# Patient Record
Sex: Male | Born: 1970 | Race: Black or African American | Hispanic: No | Marital: Single | Smoking: Former smoker
Health system: Southern US, Community
[De-identification: ages and names within clinical notes are randomized; demographics above are authoritative.]

## PROBLEM LIST (undated history)

## (undated) DIAGNOSIS — I10 Essential (primary) hypertension: Secondary | ICD-10-CM

---

## 2001-04-21 ENCOUNTER — Ambulatory Visit (HOSPITAL_COMMUNITY): Admission: RE | Admit: 2001-04-21 | Discharge: 2001-04-21 | Payer: Self-pay | Admitting: Pediatrics

## 2001-11-25 ENCOUNTER — Emergency Department (HOSPITAL_COMMUNITY): Admission: EM | Admit: 2001-11-25 | Discharge: 2001-11-25 | Payer: Self-pay | Admitting: *Deleted

## 2001-11-25 ENCOUNTER — Encounter: Payer: Self-pay | Admitting: *Deleted

## 2002-02-06 ENCOUNTER — Emergency Department (HOSPITAL_COMMUNITY): Admission: EM | Admit: 2002-02-06 | Discharge: 2002-02-06 | Payer: Self-pay | Admitting: Emergency Medicine

## 2002-05-22 ENCOUNTER — Emergency Department (HOSPITAL_COMMUNITY): Admission: EM | Admit: 2002-05-22 | Discharge: 2002-05-22 | Payer: Self-pay | Admitting: Emergency Medicine

## 2002-10-30 ENCOUNTER — Emergency Department (HOSPITAL_COMMUNITY): Admission: EM | Admit: 2002-10-30 | Discharge: 2002-10-30 | Payer: Self-pay | Admitting: Emergency Medicine

## 2009-12-07 ENCOUNTER — Emergency Department (HOSPITAL_COMMUNITY): Admission: EM | Admit: 2009-12-07 | Discharge: 2009-12-07 | Payer: Self-pay | Admitting: Emergency Medicine

## 2010-08-01 LAB — BASIC METABOLIC PANEL
BUN: 9 mg/dL (ref 6–23)
CO2: 25 mEq/L (ref 19–32)
Calcium: 9.1 mg/dL (ref 8.4–10.5)
Chloride: 103 mEq/L (ref 96–112)
Creatinine, Ser: 0.86 mg/dL (ref 0.4–1.5)
GFR calc Af Amer: >60 mL/min (ref >60)
GFR calc non Af Amer: >60 mL/min (ref >60)
Glucose, Bld: 67 mg/dL — ABNORMAL LOW (ref 70–99)
Potassium: 3.3 mEq/L — ABNORMAL LOW (ref 3.5–5.1)
Sodium: 136 mEq/L (ref 135–145)

## 2010-08-01 LAB — DIFFERENTIAL
Basophils Absolute: 0 10*3/uL (ref 0.0–0.1)
Basophils Relative: 1 % (ref 0–1)
Eosinophils Absolute: 0.1 10*3/uL (ref 0.0–0.7)
Eosinophils Relative: 3 % (ref 0–5)
Lymphocytes Relative: 46 % (ref 12–46)
Lymphs Abs: 2 10*3/uL (ref 0.7–4.0)
Monocytes Absolute: 0.4 10*3/uL (ref 0.1–1.0)
Monocytes Relative: 8 % (ref 3–12)
Neutro Abs: 1.8 10*3/uL (ref 1.7–7.7)
Neutrophils Relative %: 42 % — ABNORMAL LOW (ref 43–77)

## 2010-08-01 LAB — CBC
HCT: 46.3 % (ref 39.0–52.0)
Hemoglobin: 15.6 g/dL (ref 13.0–17.0)
MCH: 32.3 pg (ref 26.0–34.0)
MCHC: 33.8 g/dL (ref 30.0–36.0)
MCV: 95.6 fL (ref 78.0–100.0)
Platelets: 171 10*3/uL (ref 150–400)
RBC: 4.84 MIL/uL (ref 4.22–5.81)
RDW: 12.9 % (ref 11.5–15.5)
WBC: 4.3 10*3/uL (ref 4.0–10.5)

## 2010-08-01 LAB — TROPONIN I: Troponin I: 0.02 ng/mL (ref 0.00–0.06)

## 2010-08-01 LAB — CK TOTAL AND CKMB (NOT AT ARMC)
CK, MB: 1.8 ng/mL (ref 0.3–4.0)
Relative Index: 1 (ref 0.0–2.5)
Total CK: 182 U/L (ref 7–232)

## 2010-10-02 NOTE — Op Note (Signed)
[facility]  Patient:    Chad Robinson, Chad Robinson Visit Number: [id number] MRN: [id number]          Service Type: DSU Location: DAY Attending Physician:  Barbaraann Barthel Dictated by:   Barbaraann Barthel, M.D. Proc. Date: 04/21/01 Admit Date:  04/21/2001 Discharge Date: 04/21/2001   CC:         Avon Gully, M.D.   Operative Report  PREOPERATIVE DIAGNOSIS:  Phimosis.  POSTOPERATIVE DIAGNOSIS:  Phimosis.  PROCEDURE:  Circumcision.  SURGEON:  Barbaraann Barthel, M.D.  ANESTHESIA:  General anesthesia.  NOTE:  This is a 40 year old black male who had recurrent episodes of difficulty with phimosis and cleansing his penis and problems with chronic infections in this area because of his phimosis.  He was referred by Dr. Avon Gully for elective circumcision.  GROSS OPERATIVE FINDINGS:  Those consistent with a phimotic foreskin, otherwise, no other lesions noted.  TECHNIQUE:  The patient was placed in a supine position and after the adequate administration of general anesthesia via endotracheal intubation, his entire abdomen was prepped with Betadine solution and draped in the usual manner.  A dorsal and ventral incision was carried out down beyond the base of the glans penis and these incisions were then carried circumferentially around the penis and we then sutured the skin to the mucosal area with interrupted 4-0 chromic sutures.  There was a prominent dorsal vessel which was ligated with 4-0 Dexon.  Afterwards, the penis was dressed with a Xeroform gauze and a small stockinette dressing.  Prior to closure, all sponge, needle and instrument counts were found to be correct.  Estimated blood loss was minimal. There were no complications.  Please note, we had discussed preoperatively this procedure in detail with the patient as well as his sister who helps take care of him.  Informed consent was obtained. Dictated by:   Barbaraann Barthel,  M.D. Attending Physician:  Barbaraann Barthel DD:  04/21/01 TD:  04/21/01 Job: [id number]

## 2012-08-12 ENCOUNTER — Encounter (HOSPITAL_COMMUNITY): Payer: Self-pay | Admitting: *Deleted

## 2012-08-12 ENCOUNTER — Emergency Department (HOSPITAL_COMMUNITY)
Admission: EM | Admit: 2012-08-12 | Discharge: 2012-08-12 | Disposition: A | Discharge status: Home / Self Care | Payer: Medicaid Other | Attending: Emergency Medicine | Admitting: Emergency Medicine

## 2012-08-12 DIAGNOSIS — M25569 Pain in unspecified knee: Secondary | ICD-10-CM | POA: Insufficient documentation

## 2012-08-12 DIAGNOSIS — I10 Essential (primary) hypertension: Secondary | ICD-10-CM | POA: Insufficient documentation

## 2012-08-12 DIAGNOSIS — M25562 Pain in left knee: Secondary | ICD-10-CM

## 2012-08-12 DIAGNOSIS — M25469 Effusion, unspecified knee: Secondary | ICD-10-CM | POA: Insufficient documentation

## 2012-08-12 HISTORY — DX: Essential (primary) hypertension: I10

## 2012-08-12 MED ORDER — IBUPROFEN 800 MG PO TABS: 800.0000 mg | ORAL_TABLET | Freq: Three times a day (TID) | ORAL | Status: DC

## 2012-08-12 MED ORDER — IBUPROFEN 800 MG PO TABS
800.0000 mg | ORAL_TABLET | Freq: Once | ORAL | Status: AC
  Administered 2012-08-12: 800 mg via ORAL
  Filled 2012-08-12: qty 1

## 2012-08-12 NOTE — ED Provider Notes (Signed)
History     CSN: [id number]  Arrival date & time 08/12/12  0544   First MD Initiated Contact with Patient 08/12/12 [phone]      Chief Complaint  Patient presents with  . Knee Pain    left knee pain with not known injury    (Consider location/radiation/quality/duration/timing/severity/associated sxs/prior treatment) HPI Chad Robinson is a 42 y.o. male who presents to the Emergency Department complaining of left knee pain that began last night. He is unable to bend his knee all the way. 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 He has taken no medicines.  PCP Dr. Felecia Shelling  Past Medical History  Diagnosis Date  . Hypertension     History reviewed. No pertinent past surgical history.  No family history on file.  History  Substance Use Topics  . Smoking status: Never Smoker   . Smokeless tobacco: Never Used  . Alcohol Use: 2.4 oz/week    4 Cans of beer per week      Review of Systems  Constitutional: Negative for fever.       10 Systems reviewed and are negative for acute change except as noted in the HPI.  HENT: Negative for congestion.   Eyes: Negative for discharge and redness.  Respiratory: Negative for cough and shortness of breath.   Cardiovascular: Negative for chest pain.  Gastrointestinal: Negative for vomiting and abdominal pain.  Musculoskeletal: Negative for back pain.       Left knee pain and swelling  Skin: Negative for rash.  Neurological: Negative for syncope, numbness and headaches.  Psychiatric/Behavioral:       No behavior change.    Allergies  Review of patient's allergies indicates no known allergies.  Home Medications  No current outpatient prescriptions on file.  BP 129/94  Temp(Src) 99.5 F (37.5 C) (Oral)  Resp 20  Ht 5\' 11"  (1.803 m)  Wt 210 lb (95.255 kg)  BMI 29.3 kg/m2  SpO2 98%  Physical Exam  Nursing note and vitals reviewed. Constitutional: He appears well-developed and well-nourished.   Awake, alert, nontoxic appearance.  HENT:  Head: Normocephalic and atraumatic.  Eyes: EOM are normal. Pupils are equal, round, and reactive to light. Right eye exhibits no discharge. Left eye exhibits no discharge.  Neck: Neck supple.  Cardiovascular: Normal rate and intact distal pulses.   Pulmonary/Chest: Effort normal and breath sounds normal. He exhibits no tenderness.  Abdominal: Soft. Bowel sounds are normal. There is no tenderness. There is no rebound.  Musculoskeletal: He exhibits no tenderness.  Baseline ROM, no obvious new focal weakness. Left knee with mild effusion and tenderness to joint line on medial side of knee. Pulses 2+.  Neurological:  Mental status and motor strength appears baseline for patient and situation.  Skin: No rash noted.  Psychiatric: He has a normal mood and affect.    ED Course  Procedures (including critical care time)     MDM  Patient with left knee pain and mild swelling. Given ibuprofen. Pt stable in ED with no significant deterioration in condition.The patient appears reasonably screened and/or stabilized for discharge and I doubt any other medical condition or other [facility] requiring further screening, evaluation, or treatment in the ED at this time prior to discharge.  MDM Reviewed: nursing note and vitals           Nicoletta Dress. Colon Branch, MD 08/12/12 [phone]

## 2012-08-14 ENCOUNTER — Encounter (HOSPITAL_COMMUNITY): Payer: Self-pay | Admitting: *Deleted

## 2012-08-14 ENCOUNTER — Emergency Department (HOSPITAL_COMMUNITY)
Admission: EM | Admit: 2012-08-14 | Discharge: 2012-08-15 | Disposition: A | Discharge status: Home / Self Care | Payer: Medicaid Other | Attending: Emergency Medicine | Admitting: Emergency Medicine

## 2012-08-14 DIAGNOSIS — R110 Nausea: Secondary | ICD-10-CM | POA: Insufficient documentation

## 2012-08-14 DIAGNOSIS — R197 Diarrhea, unspecified: Secondary | ICD-10-CM

## 2012-08-14 DIAGNOSIS — N400 Enlarged prostate without lower urinary tract symptoms: Secondary | ICD-10-CM | POA: Insufficient documentation

## 2012-08-14 DIAGNOSIS — I10 Essential (primary) hypertension: Secondary | ICD-10-CM | POA: Insufficient documentation

## 2012-08-14 DIAGNOSIS — R109 Unspecified abdominal pain: Secondary | ICD-10-CM

## 2012-08-14 DIAGNOSIS — Z79899 Other long term (current) drug therapy: Secondary | ICD-10-CM | POA: Insufficient documentation

## 2012-08-14 DIAGNOSIS — D1809 Hemangioma of other sites: Secondary | ICD-10-CM | POA: Insufficient documentation

## 2012-08-14 DIAGNOSIS — R1084 Generalized abdominal pain: Secondary | ICD-10-CM | POA: Insufficient documentation

## 2012-08-14 DIAGNOSIS — D1803 Hemangioma of intra-abdominal structures: Secondary | ICD-10-CM

## 2012-08-14 LAB — CBC WITH DIFFERENTIAL/PLATELET
Basophils Relative: 0 % (ref 0–1)
Eosinophils Absolute: 0 10*3/uL (ref 0.0–0.7)
HCT: 39.2 % (ref 39.0–52.0)
Hemoglobin: 13.6 g/dL (ref 13.0–17.0)
Lymphs Abs: 1.5 10*3/uL (ref 0.7–4.0)
MCH: 31.4 pg (ref 26.0–34.0)
MCHC: 34.7 g/dL (ref 30.0–36.0)
Monocytes Absolute: 0.8 10*3/uL (ref 0.1–1.0)
Monocytes Relative: 9 % (ref 3–12)
Neutrophils Relative %: 75 % (ref 43–77)
RBC: 4.33 MIL/uL (ref 4.22–5.81)

## 2012-08-14 LAB — BASIC METABOLIC PANEL
BUN: 10 mg/dL (ref 6–23)
Chloride: 93 mEq/L — ABNORMAL LOW (ref 96–112)
Creatinine, Ser: 0.87 mg/dL (ref 0.50–1.35)
GFR calc Af Amer: >90 mL/min (ref >90)
GFR calc non Af Amer: >90 mL/min (ref >90)
Glucose, Bld: 125 mg/dL — ABNORMAL HIGH (ref 70–99)
Potassium: 3.5 mEq/L (ref 3.5–5.1)

## 2012-08-14 MED ORDER — ONDANSETRON HCL 4 MG/2ML IJ SOLN: 4.0000 mg | Freq: Once | INTRAMUSCULAR | Status: DC

## 2012-08-14 MED ORDER — MORPHINE SULFATE 4 MG/ML IJ SOLN
4.0000 mg | Freq: Once | INTRAMUSCULAR | Status: AC
  Administered 2012-08-15: 4 mg via INTRAVENOUS
  Filled 2012-08-14: qty 1

## 2012-08-14 MED ORDER — SODIUM CHLORIDE 0.9 % IV SOLN
Freq: Once | INTRAVENOUS | Status: AC
  Administered 2012-08-15: via INTRAVENOUS

## 2012-08-14 MED ORDER — ONDANSETRON HCL 4 MG/2ML IJ SOLN
4.0000 mg | Freq: Once | INTRAMUSCULAR | Status: AC
  Administered 2012-08-14: 4 mg via INTRAVENOUS
  Filled 2012-08-14: qty 2

## 2012-08-14 NOTE — ED Notes (Signed)
abd pain, onset this pm.   Nausea, no vomiting , Has diarrhea.  Dizzy.  Seen here on 3/29 for knee pain

## 2012-08-14 NOTE — ED Notes (Signed)
Dr Bebe Shaggy in room assessing pt at this time

## 2012-08-14 NOTE — ED Notes (Signed)
Pt states abdominal pain since yesterday, notes some diarrhea and nausea, denies vomiting. Abd appears distended. Notes taking ibuprofin 800mg  for a couple days and thinks that might be part of it.

## 2012-08-15 ENCOUNTER — Emergency Department (HOSPITAL_COMMUNITY): Payer: Medicaid Other

## 2012-08-15 ENCOUNTER — Encounter (HOSPITAL_COMMUNITY): Payer: Self-pay | Admitting: Radiology

## 2012-08-15 ENCOUNTER — Other Ambulatory Visit: Payer: Self-pay

## 2012-08-15 LAB — URINALYSIS, ROUTINE W REFLEX MICROSCOPIC
Bilirubin Urine: NEGATIVE
Glucose, UA: NEGATIVE mg/dL
Ketones, ur: NEGATIVE mg/dL
Protein, ur: 100 mg/dL — AB
pH: 6 (ref 5.0–8.0)

## 2012-08-15 LAB — URINE MICROSCOPIC-ADD ON

## 2012-08-15 LAB — HEPATIC FUNCTION PANEL
AST: 18 U/L (ref 0–37)
Albumin: 3.7 g/dL (ref 3.5–5.2)
Alkaline Phosphatase: 66 U/L (ref 39–117)
Bilirubin, Direct: 0.2 mg/dL (ref 0.0–0.3)
Total Bilirubin: 0.6 mg/dL (ref 0.3–1.2)

## 2012-08-15 MED ORDER — OXYCODONE-ACETAMINOPHEN 5-325 MG PO TABS: 1.0000 | ORAL_TABLET | ORAL | Status: DC | PRN

## 2012-08-15 MED ORDER — HYDROMORPHONE HCL PF 1 MG/ML IJ SOLN
1.0000 mg | Freq: Once | INTRAMUSCULAR | Status: AC
  Administered 2012-08-15: 1 mg via INTRAVENOUS
  Filled 2012-08-15: qty 1

## 2012-08-15 MED ORDER — IOHEXOL 300 MG/ML  SOLN
50.0000 mL | Freq: Once | INTRAMUSCULAR | Status: AC | PRN
  Administered 2012-08-15: 50 mL via ORAL

## 2012-08-15 MED ORDER — IOHEXOL 300 MG/ML  SOLN
100.0000 mL | Freq: Once | INTRAMUSCULAR | Status: AC | PRN
  Administered 2012-08-15: 100 mL via INTRAVENOUS

## 2012-08-15 NOTE — ED Notes (Signed)
Pt still unable to give urine sample, will do in and out cath when pt returns from CT

## 2012-08-15 NOTE — ED Provider Notes (Signed)
History     CSN: [id number]  Arrival date & time 08/14/12  2216   First MD Initiated Contact with Patient 08/14/12 2334      Chief Complaint  Patient presents with  . Abdominal Pain     Patient is a 42 y.o. male presenting with abdominal pain. The history is provided by the patient.  Abdominal Pain Pain location:  Generalized Pain quality: aching   Pain radiates to:  Does not radiate Pain severity:  Moderate Onset quality:  Gradual Duration:  1 day Timing:  Constant Progression:  Worsening Chronicity:  New Context comment:  Ibuprofen use Relieved by:  Nothing Worsened by:  Palpation Associated symptoms: diarrhea and nausea   Associated symptoms: no chest pain, no constipation, no cough, no dysuria, no fever, no hematemesis, no hematochezia and no vomiting     Past Medical History  Diagnosis Date  . Hypertension     Surgical History - none reported  History reviewed. No pertinent family history.  History  Substance Use Topics  . Smoking status: Never Smoker   . Smokeless tobacco: Never Used  . Alcohol Use: 2.4 oz/week    4 Cans of beer per week      Review of Systems  Constitutional: Negative for fever.  Respiratory: Negative for cough.   Cardiovascular: Negative for chest pain.  Gastrointestinal: Positive for nausea, abdominal pain and diarrhea. Negative for vomiting, constipation, blood in stool, hematochezia and hematemesis.  Genitourinary: Negative for dysuria.  Musculoskeletal: Positive for back pain.  Neurological: Negative for weakness.  Psychiatric/Behavioral: Negative for agitation.  All other systems reviewed and are negative.    Allergies  Review of patient's allergies indicates no known allergies.  Home Medications   Current Outpatient Rx  Name  Route  Sig  Dispense  Refill  . aspirin-sod bicarb-citric acid (ALKA-SELTZER) 325 MG TBEF   Oral   Take 325 mg by mouth once as needed (for stomach pain relief).         Marland Kitchen ibuprofen  (ADVIL,MOTRIN) 800 MG tablet   Oral   Take 1 tablet (800 mg total) by mouth 3 (three) times daily.   21 tablet   0   . lisinopril-hydrochlorothiazide (PRINZIDE,ZESTORETIC) 10-12.5 MG per tablet   Oral   Take 1 tablet by mouth daily.           BP 138/99  Pulse 101  Temp(Src) 99.9 F (37.7 C) (Oral)  Resp 20  Ht 5\' 11"  (1.803 m)  Wt 210 lb (95.255 kg)  BMI 29.3 kg/m2  SpO2 98% BP 137/69  Pulse 86  Temp(Src) 99.8 F (37.7 C) (Oral)  Resp 20  Ht 5\' 11"  (1.803 m)  Wt 210 lb (95.255 kg)  BMI 29.3 kg/m2  SpO2 98%   Physical Exam CONSTITUTIONAL: Well developed/well nourished HEAD: Normocephalic/atraumatic EYES: EOMI/PERRL ENMT: Mucous membranes moist NECK: supple no meningeal signs SPINE:entire spine nontender, cervical paraspinal tenderness CV: S1/S2 noted, no murmurs/rubs/gallops noted LUNGS: Lungs are clear to auscultation bilaterally, no apparent distress ABDOMEN: soft but distended, diffuse tenderness, tenderness is moderate, no rebound or guarding, hypoactive BS noted GU:no cva tenderness. No inguinal tenderness to suggest hernia NEURO: Pt is awake/alert, moves all extremitiesx4 EXTREMITIES: pulses normal, full ROM SKIN: warm, color normal PSYCH: no abnormalities of mood noted  ED Course  Procedures   Labs Reviewed  BASIC METABOLIC PANEL - Abnormal; Notable for the following:    Chloride 93 (*)    Glucose, Bld 125 (*)    All other components within  normal limits  CBC WITH DIFFERENTIAL  LACTIC ACID, PLASMA  HEPATIC FUNCTION PANEL  LIPASE, BLOOD  URINALYSIS, ROUTINE W REFLEX MICROSCOPIC   Dg Abd Acute W/chest  08/15/2012  *RADIOLOGY REPORT*  Clinical Data: Abdominal pain, neck pain, and bilateral knee pain.  ACUTE ABDOMEN SERIES (ABDOMEN 2 VIEW & CHEST 1 VIEW)  Comparison: None.  Findings: Mild cardiac enlargement with normal pulmonary vascularity.  No focal airspace disease or consolidation in the lungs.  No blunting of costophrenic angles.  No  pneumothorax.  Gas and stool in the colon without distension.  There are a few mildly distended gas-filled mid abdominal small bowel loops with a few air-fluid levels.  Early obstruction may be present.  No free intra-abdominal air.  No radiopaque stones.  Degenerative changes in the spine.  IMPRESSION: No evidence of active pulmonary disease.  Mild gaseous distension of mid abdominal small bowel with air-fluid levels suggesting early obstruction.   Original Report Authenticated By: Burman Nieves, M.D.    12:51 AM Pt with continued abdominal pain/nausea Will obtain CT imaging to further evaluate abdominal pain.  He tells me he has never had this before, and he appears to have significant pain He reports this started after using ibuprofen for knee pain 3:37 AM CT abd/pelvis does not reveal any acute abdominal process He does have ?liver hemangioma Outpatient MRI advised by radiology I told patient need for outpatient MRI as this may be cancer EPIC note sent to PCP about need for MRI Otherwise, given abdominal pain/diarrhea, may have viral illness.  He still reports abdominal pain but his abdomen is soft and his labs are reassuring    MDM  Nursing notes including past medical history and social history reviewed and considered in documentation xrays reviewed and considered Labs/vital reviewed and considered Previous records reviewed and considered - recent ED visit reviewed        Date: 08/15/2012  Rate: 88  Rhythm: normal sinus rhythm  QRS Axis: normal  Intervals: normal  ST/T Wave abnormalities: nonspecific ST changes  Conduction Disutrbances:none  Narrative Interpretation: LVH noted  Old EKG Reviewed: unchanged    Joya Gaskins, MD 08/15/12 [phone]

## 2012-08-15 NOTE — ED Notes (Signed)
Pt cannot urinate at this time, stats he will attempt to again shortly.

## 2012-08-15 NOTE — ED Notes (Signed)
afred NT in room to do in and out cath. Pt given cup of water.

## 2012-08-15 NOTE — ED Notes (Signed)
Pt attempting to urinate again before attempting the in and out cath

## 2012-08-17 ENCOUNTER — Other Ambulatory Visit (HOSPITAL_COMMUNITY): Payer: Self-pay | Admitting: Internal Medicine

## 2012-08-17 ENCOUNTER — Ambulatory Visit (HOSPITAL_COMMUNITY)
Admission: RE | Admit: 2012-08-17 | Discharge: 2012-08-17 | Disposition: A | Discharge status: Home / Self Care | Payer: Medicaid Other | Source: Ambulatory Visit | Attending: Internal Medicine | Admitting: Internal Medicine

## 2012-08-17 DIAGNOSIS — M25569 Pain in unspecified knee: Secondary | ICD-10-CM | POA: Insufficient documentation

## 2012-08-17 DIAGNOSIS — M25562 Pain in left knee: Secondary | ICD-10-CM

## 2012-09-04 ENCOUNTER — Other Ambulatory Visit (HOSPITAL_COMMUNITY): Payer: Self-pay | Admitting: Orthopaedic Surgery

## 2012-09-04 DIAGNOSIS — M25562 Pain in left knee: Secondary | ICD-10-CM

## 2012-09-07 ENCOUNTER — Ambulatory Visit (HOSPITAL_COMMUNITY)
Admission: RE | Admit: 2012-09-07 | Discharge: 2012-09-07 | Disposition: A | Discharge status: Home / Self Care | Payer: Medicaid Other | Source: Ambulatory Visit | Attending: Orthopaedic Surgery | Admitting: Orthopaedic Surgery

## 2012-09-07 DIAGNOSIS — M25569 Pain in unspecified knee: Secondary | ICD-10-CM | POA: Insufficient documentation

## 2012-09-07 DIAGNOSIS — M25469 Effusion, unspecified knee: Secondary | ICD-10-CM | POA: Insufficient documentation

## 2012-09-07 DIAGNOSIS — M2240 Chondromalacia patellae, unspecified knee: Secondary | ICD-10-CM | POA: Insufficient documentation

## 2012-09-07 DIAGNOSIS — M25562 Pain in left knee: Secondary | ICD-10-CM

## 2012-11-15 ENCOUNTER — Encounter (HOSPITAL_COMMUNITY): Payer: Self-pay | Admitting: *Deleted

## 2012-11-15 ENCOUNTER — Emergency Department (HOSPITAL_COMMUNITY)
Admission: EM | Admit: 2012-11-15 | Discharge: 2012-11-15 | Disposition: A | Discharge status: Home / Self Care | Payer: Medicaid Other | Attending: Emergency Medicine | Admitting: Emergency Medicine

## 2012-11-15 DIAGNOSIS — Y9289 Other specified places as the place of occurrence of the external cause: Secondary | ICD-10-CM | POA: Insufficient documentation

## 2012-11-15 DIAGNOSIS — R21 Rash and other nonspecific skin eruption: Secondary | ICD-10-CM | POA: Insufficient documentation

## 2012-11-15 DIAGNOSIS — Y939 Activity, unspecified: Secondary | ICD-10-CM | POA: Insufficient documentation

## 2012-11-15 DIAGNOSIS — T6391XA Toxic effect of contact with unspecified venomous animal, accidental (unintentional), initial encounter: Secondary | ICD-10-CM | POA: Insufficient documentation

## 2012-11-15 DIAGNOSIS — I10 Essential (primary) hypertension: Secondary | ICD-10-CM | POA: Insufficient documentation

## 2012-11-15 DIAGNOSIS — Z79899 Other long term (current) drug therapy: Secondary | ICD-10-CM | POA: Insufficient documentation

## 2012-11-15 DIAGNOSIS — T63461A Toxic effect of venom of wasps, accidental (unintentional), initial encounter: Secondary | ICD-10-CM | POA: Insufficient documentation

## 2012-11-15 DIAGNOSIS — R609 Edema, unspecified: Secondary | ICD-10-CM | POA: Insufficient documentation

## 2012-11-15 MED ORDER — DIPHENHYDRAMINE HCL 25 MG PO CAPS
25.0000 mg | ORAL_CAPSULE | Freq: Once | ORAL | Status: AC
  Administered 2012-11-15: 25 mg via ORAL
  Filled 2012-11-15: qty 1

## 2012-11-15 MED ORDER — DIPHENHYDRAMINE HCL 25 MG PO TABS: 25.0000 mg | ORAL_TABLET | Freq: Four times a day (QID) | ORAL | Status: DC

## 2012-11-15 MED ORDER — PREDNISONE 10 MG PO TABS: 20.0000 mg | ORAL_TABLET | Freq: Two times a day (BID) | ORAL | Status: DC

## 2012-11-15 MED ORDER — PREDNISONE 20 MG PO TABS
40.0000 mg | ORAL_TABLET | Freq: Once | ORAL | Status: AC
  Administered 2012-11-15: 40 mg via ORAL
  Filled 2012-11-15: qty 2

## 2012-11-15 MED ORDER — FAMOTIDINE 20 MG PO TABS
20.0000 mg | ORAL_TABLET | Freq: Once | ORAL | Status: AC
  Administered 2012-11-15: 20 mg via ORAL
  Filled 2012-11-15: qty 1

## 2012-11-15 MED ORDER — FAMOTIDINE 20 MG PO TABS: 20.0000 mg | ORAL_TABLET | Freq: Two times a day (BID) | ORAL | Status: DC

## 2012-11-15 NOTE — ED Provider Notes (Signed)
History    CSN: [id number] Arrival date & time 11/15/12  1159  First MD Initiated Contact with Patient 11/15/12 1215     No chief complaint on file.  (Consider location/radiation/quality/duration/timing/severity/associated sxs/prior Treatment) The history is provided by the patient.  Chad Robinson is a 42 y.o. male who presents to the ED with swelling and itching to the left side of his face after a bee sting above his left eye. He states that he was outside working, picking up sticks and he felt something burning on his face and slapped at it and knocked it off. Shortly after that he noted swelling around his eyes and hive like areas. He denies nausea or vomiting, shortness of breath or other problems. The sting occurred approximately 20 minutes prior to arrival to the ED. Past Medical History  Diagnosis Date  . Hypertension    History reviewed. No pertinent past surgical history. History reviewed. No pertinent family history. History  Substance Use Topics  . Smoking status: Never Smoker   . Smokeless tobacco: Never Used  . Alcohol Use: 2.4 oz/week    4 Cans of beer per week    Review of Systems  Constitutional: Negative for fever and chills.  HENT: Positive for facial swelling. Negative for ear pain, sore throat, trouble swallowing and neck pain.   Eyes: Positive for redness and itching. Negative for pain and visual disturbance.  Respiratory: Negative for cough and shortness of breath.   Gastrointestinal: Negative for nausea, vomiting and abdominal pain.  Musculoskeletal: Negative for joint swelling.  Skin: Positive for rash.  Neurological: Negative for light-headedness and headaches.  Psychiatric/Behavioral: The patient is not nervous/anxious.     Allergies  Review of patient's allergies indicates no known allergies.  Home Medications   Current Outpatient Rx  Name  Route  Sig  Dispense  Refill  . lisinopril-hydrochlorothiazide (PRINZIDE,ZESTORETIC) 10-12.5 MG per  tablet   Oral   Take 1 tablet by mouth daily.         Marland Kitchen oxyCODONE-acetaminophen (PERCOCET/ROXICET) 5-325 MG per tablet   Oral   Take 1 tablet by mouth every 4 (four) hours as needed for pain.   10 tablet   0    BP 119/96  Pulse 75  Temp(Src) 98.3 F (36.8 C) (Oral)  Resp 20  Ht 5\' 11"  (1.803 m)  Wt 210 lb (95.255 kg)  BMI 29.3 kg/m2  SpO2 100% Physical Exam  Nursing note and vitals reviewed. Constitutional: He is oriented to person, place, and time. He appears well-developed and well-nourished. No distress.  HENT:  Swelling and hive like area above the right eye. There is a tiny puncture area noted at site of sting.  Eyes: EOM are normal. Pupils are equal, round, and reactive to light. Left eye exhibits no discharge. Left conjunctiva is injected. Left eye exhibits no nystagmus.  Neck: Normal range of motion. Neck supple.  Few hive like areas to left side of neck.  Cardiovascular: Normal rate and regular rhythm.   Pulmonary/Chest: Effort normal and breath sounds normal. He has no wheezes.  Musculoskeletal: Normal range of motion.  Neurological: He is alert and oriented to person, place, and time. No cranial nerve deficit.  Skin: Skin is warm and dry.  Psychiatric: He has a normal mood and affect. His behavior is normal.    ED Course: patient continues to say I need to leave. Give me some medicine so I can get out of here.   Procedures  Benadryl, Prednisone and Pepcid  given in the ED ask the patient to wait for a few minutes before leaving. He states he is fine and as work to do and needs his discharge papers so he can go.  MDM  42 y.o. male with localized allergic reaction to bee sting discussed with the patient clinical findings and plan of care. He voices understanding. Stable for discharge home. He will return for any problems.    Medication List    TAKE these medications       diphenhydrAMINE 25 MG tablet  Commonly known as:  BENADRYL  Take 1 tablet (25 mg total)  by mouth every 6 (six) hours.     famotidine 20 MG tablet  Commonly known as:  PEPCID  Take 1 tablet (20 mg total) by mouth 2 (two) times daily.     predniSONE 10 MG tablet  Commonly known as:  DELTASONE  Take 2 tablets (20 mg total) by mouth 2 (two) times daily.      ASK your doctor about these medications       lisinopril-hydrochlorothiazide 10-12.5 MG per tablet  Commonly known as:  PRINZIDE,ZESTORETIC  Take 1 tablet by mouth daily.     multivitamin with minerals Tabs  Take 1 tablet by mouth daily.     oxyCODONE-acetaminophen 5-325 MG per tablet  Commonly known as:  PERCOCET/ROXICET  Take 1 tablet by mouth daily.         Janne Napoleon, [place] 11/15/12 1235

## 2012-11-15 NOTE — ED Notes (Signed)
Bee sting to lt eye 20 min pta, with hives developing to neck.  No Sob , no sore throat

## 2012-11-15 NOTE — ED Provider Notes (Signed)
Medical screening examination/treatment/procedure(s) were performed by non-physician practitioner and as supervising physician I was immediately available for consultation/collaboration. Devoria Albe, MD, Armando Gang   Ward Givens, MD 11/15/12 [phone]

## 2014-07-15 ENCOUNTER — Emergency Department (HOSPITAL_COMMUNITY)
Admission: EM | Admit: 2014-07-15 | Discharge: 2014-07-15 | Disposition: A | Discharge status: Home / Self Care | Payer: Medicaid Other | Attending: Emergency Medicine | Admitting: Emergency Medicine

## 2014-07-15 ENCOUNTER — Encounter (HOSPITAL_COMMUNITY): Payer: Self-pay | Admitting: *Deleted

## 2014-07-15 ENCOUNTER — Emergency Department (HOSPITAL_COMMUNITY): Payer: Medicaid Other

## 2014-07-15 DIAGNOSIS — I10 Essential (primary) hypertension: Secondary | ICD-10-CM | POA: Insufficient documentation

## 2014-07-15 DIAGNOSIS — Z79899 Other long term (current) drug therapy: Secondary | ICD-10-CM | POA: Diagnosis not present

## 2014-07-15 DIAGNOSIS — M5441 Lumbago with sciatica, right side: Secondary | ICD-10-CM | POA: Diagnosis not present

## 2014-07-15 DIAGNOSIS — M545 Low back pain: Secondary | ICD-10-CM | POA: Diagnosis present

## 2014-07-15 LAB — BASIC METABOLIC PANEL
ANION GAP: 5 (ref 5–15)
BUN: 15 mg/dL (ref 6–23)
CHLORIDE: 107 mmol/L (ref 96–112)
CO2: 28 mmol/L (ref 19–32)
CREATININE: 0.84 mg/dL (ref 0.50–1.35)
Calcium: 9.3 mg/dL (ref 8.4–10.5)
GFR calc non Af Amer: >90 mL/min (ref >90)
Glucose, Bld: 104 mg/dL — ABNORMAL HIGH (ref 70–99)
POTASSIUM: 3.6 mmol/L (ref 3.5–5.1)
Sodium: 140 mmol/L (ref 135–145)

## 2014-07-15 MED ORDER — CYCLOBENZAPRINE HCL 10 MG PO TABS: 10.0000 mg | ORAL_TABLET | Freq: Two times a day (BID) | ORAL | Status: DC | PRN

## 2014-07-15 MED ORDER — HYDROCODONE-ACETAMINOPHEN 5-325 MG PO TABS: 1.0000 | ORAL_TABLET | ORAL | Status: DC | PRN

## 2014-07-15 MED ORDER — KETOROLAC TROMETHAMINE 10 MG PO TABS
10.0000 mg | ORAL_TABLET | Freq: Once | ORAL | Status: AC
  Administered 2014-07-15: 10 mg via ORAL
  Filled 2014-07-15: qty 1

## 2014-07-15 MED ORDER — NAPROXEN 500 MG PO TABS: 500.0000 mg | ORAL_TABLET | Freq: Two times a day (BID) | ORAL | Status: DC

## 2014-07-15 MED ORDER — ACETAMINOPHEN 325 MG PO TABS
650.0000 mg | ORAL_TABLET | Freq: Once | ORAL | Status: AC
  Administered 2014-07-15: 650 mg via ORAL
  Filled 2014-07-15: qty 2

## 2014-07-15 NOTE — ED Notes (Signed)
Pain rt lower back, sudden onset, Increased pain with motion , no n/v   No dysuria

## 2014-07-15 NOTE — ED Provider Notes (Signed)
CSN: [id number]     Arrival date & time 07/15/14  1544 History   First MD Initiated Contact with Patient 07/15/14 1617     Chief Complaint  Patient presents with  . Back Pain     (Consider location/radiation/quality/duration/timing/severity/associated sxs/prior Treatment) Patient is a 44 y.o. male presenting with back pain. The history is provided by the patient.  Back Pain Location:  Lumbar spine Quality:  Aching, cramping and shooting Radiates to:  Does not radiate Pain severity:  Severe Onset quality:  Sudden Duration:  3 hours Timing:  Intermittent Progression:  Worsening Chronicity:  New Context: not jumping from heights, not physical stress and not recent injury   Relieved by:  Nothing Worsened by:  Movement, bending and twisting Associated symptoms: no abdominal pain, no bladder incontinence, no bowel incontinence, no chest pain, no dysuria and no fever     Past Medical History  Diagnosis Date  . Hypertension    History reviewed. No pertinent past surgical history. History reviewed. No pertinent family history. History  Substance Use Topics  . Smoking status: Never Smoker   . Smokeless tobacco: Never Used  . Alcohol Use: 2.4 oz/week    4 Cans of beer per week    Review of Systems  Constitutional: Negative for fever and activity change.       All ROS Neg except as noted in HPI  HENT: Negative.   Eyes: Negative for photophobia and discharge.  Respiratory: Negative for cough, shortness of breath and wheezing.   Cardiovascular: Negative for chest pain and palpitations.  Gastrointestinal: Negative for abdominal pain, blood in stool and bowel incontinence.  Genitourinary: Negative for bladder incontinence, dysuria, frequency and hematuria.  Musculoskeletal: Positive for back pain. Negative for arthralgias and neck pain.  Skin: Negative.   Neurological: Negative for dizziness, seizures and speech difficulty.  Psychiatric/Behavioral: Negative for hallucinations and  confusion.      Allergies  Fish allergy  Home Medications   Prior to Admission medications   Medication Sig Start Date End Date Taking? Authorizing Provider  lisinopril-hydrochlorothiazide (PRINZIDE,ZESTORETIC) 10-12.5 MG per tablet Take 1 tablet by mouth daily.   Yes Historical Provider, MD  Multiple Vitamin (MULTIVITAMIN WITH MINERALS) TABS Take 1 tablet by mouth daily.   Yes Historical Provider, MD   BP 138/91 mmHg  Pulse 78  Temp(Src) 98 F (36.7 C) (Oral)  Resp 18  Ht 5\' 11"  (1.803 m)  Wt 199 lb (90.266 kg)  BMI 27.77 kg/m2  SpO2 98% Physical Exam  Constitutional: He is oriented to person, place, and time. He appears well-developed and well-nourished.  Non-toxic appearance.  HENT:  Head: Normocephalic.  Right Ear: Tympanic membrane and external ear normal.  Left Ear: Tympanic membrane and external ear normal.  Eyes: EOM and lids are normal. Pupils are equal, round, and reactive to light.  Neck: Normal range of motion. Neck supple. Carotid bruit is not present.  Cardiovascular: Normal rate, regular rhythm, normal heart sounds, intact distal pulses and normal pulses.   Pulmonary/Chest: Breath sounds normal. No respiratory distress.  Abdominal: Soft. Bowel sounds are normal. There is no tenderness. There is no guarding.  Musculoskeletal: Normal range of motion.       Lumbar back: He exhibits tenderness, bony tenderness and swelling.       Arms: Lymphadenopathy:       Head (right side): No submandibular adenopathy present.       Head (left side): No submandibular adenopathy present.    He has no cervical adenopathy.  Neurological: He is alert and oriented to person, place, and time. He has normal strength. No cranial nerve deficit or sensory deficit.  Skin: Skin is warm and dry.  Psychiatric: He has a normal mood and affect. His speech is normal.  Nursing note and vitals reviewed.   ED Course  Pt's care to be continued by Delano Metz, NP  [phone].  Procedures (including  critical care time) Labs Review Labs Reviewed - No data to display  Imaging Review No results found.   EKG Interpretation None      MDM Pt presents to ED with increasing spasm of the back on the right. He has problem with walking, which is new for him. No focal injury reported.  Consider Electrolyte imbalance, DDD, Muscle spasm. No gross neurovascular deficit noted.    Final diagnoses:  None    **I have reviewed nursing notes, vital signs, and all appropriate lab and imaging results for this patient.Lenox Ahr, PA-C 07/16/14 2022  Fredia Sorrow, MD 07/18/14 [phone]

## 2014-07-15 NOTE — Discharge Instructions (Signed)
Do not take the narcotic or muscle relaxant if driving because they will make you sleepy. Follow up with Dr. Aline Brochure.

## 2014-07-15 NOTE — ED Provider Notes (Signed)
Chad Robinson is a 44 y.o. male whose care was turned over to me from [place], [facility]. Patient awaiting CT results.  Ct Lumbar Spine Wo Contrast  07/15/2014   CLINICAL DATA:  Right lower back pain, increased with motion  EXAM: CT LUMBAR SPINE WITHOUT CONTRAST  TECHNIQUE: Multidetector CT imaging of the lumbar spine was performed without intravenous contrast administration. Multiplanar CT image reconstructions were also generated.  COMPARISON:  None.  FINDINGS: Normal thoracic kyphosis.  No evidence of fracture or dislocation. Vertebral body heights are maintained.  L1-2:  Within normal limits.  L2-3:  Anterior osteophytosis.  L3-4:  Mild diffuse disc bulge.  Anterior osteophytosis.  L4-5:  Mild diffuse disc bulge.  L5-S1:  Mild disc space narrowing with mild diffuse disc bulge.  Visualized soft tissues are within normal limits. Specifically, no renal or ureteral calculi are visualized. No hydronephrosis.  IMPRESSION: No fracture or dislocation is seen.  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   Electronically Signed   By: Julian Hy M.D.   On: 07/15/2014 17:37    Patient given Toradol 60 mg IM and brought his pain from 8/10 to 5/10. I discussed CT findings and patient with Dr. Rogene Houston. Will treat for pain, muscle spasm and inflammation and will have patient follow up with Dr. Aline Brochure. He will return here as needed. Stable for discharge with steady gait.  [place], NP 07/15/14 1811  Fredia Sorrow, MD 07/16/14 [phone]

## 2015-04-23 ENCOUNTER — Encounter (HOSPITAL_COMMUNITY): Payer: Self-pay | Admitting: *Deleted

## 2015-04-23 ENCOUNTER — Emergency Department (HOSPITAL_COMMUNITY)
Admission: EM | Admit: 2015-04-23 | Discharge: 2015-04-23 | Disposition: A | Discharge status: Home / Self Care | Payer: Medicaid Other | Attending: Emergency Medicine | Admitting: Emergency Medicine

## 2015-04-23 DIAGNOSIS — R040 Epistaxis: Secondary | ICD-10-CM | POA: Insufficient documentation

## 2015-04-23 DIAGNOSIS — Z791 Long term (current) use of non-steroidal anti-inflammatories (NSAID): Secondary | ICD-10-CM | POA: Diagnosis not present

## 2015-04-23 DIAGNOSIS — Z79899 Other long term (current) drug therapy: Secondary | ICD-10-CM | POA: Insufficient documentation

## 2015-04-23 DIAGNOSIS — I10 Essential (primary) hypertension: Secondary | ICD-10-CM | POA: Diagnosis not present

## 2015-04-23 MED ORDER — SILVER NITRATE-POT NITRATE 75-25 % EX MISC
CUTANEOUS | Status: AC
  Filled 2015-04-23: qty 1

## 2015-04-23 MED ORDER — OXYMETAZOLINE HCL 0.05 % NA SOLN
1.0000 | Freq: Once | NASAL | Status: AC
  Administered 2015-04-23: 1 via NASAL
  Filled 2015-04-23: qty 15

## 2015-04-23 NOTE — ED Provider Notes (Signed)
CSN: [id number]     Arrival date & time 04/23/15  1834 History   First MD Initiated Contact with Patient 04/23/15 1923     Chief Complaint  Patient presents with  . Epistaxis     (Consider location/radiation/quality/duration/timing/severity/associated sxs/prior Treatment) Patient is a 44 y.o. male presenting with nosebleeds. The history is provided by the patient. No language interpreter was used.  Epistaxis  Chad Robinson is a 44 y.o. male who presents to the ED after he had a nose bleed that started while eating dinner at home tonight. The bleeding stopped but he came in to find out why it had occurred. He denies any other problems.  Past Medical History  Diagnosis Date  . Hypertension    History reviewed. No pertinent past surgical history. History reviewed. No pertinent family history. Social History  Substance Use Topics  . Smoking status: Never Smoker   . Smokeless tobacco: Never Used  . Alcohol Use: 2.4 oz/week    4 Cans of beer per week    Review of Systems  HENT: Positive for nosebleeds.   all other systems negative    Allergies  Fish allergy  Home Medications   Prior to Admission medications   Medication Sig Start Date End Date Taking? Authorizing Provider  cyclobenzaprine (FLEXERIL) 10 MG tablet Take 1 tablet (10 mg total) by mouth 2 (two) times daily as needed for muscle spasms. 07/15/14   Hope Bunnie Pion, NP  HYDROcodone-acetaminophen (NORCO/VICODIN) 5-325 MG per tablet Take 1 tablet by mouth every 4 (four) hours as needed. 07/15/14   Hope Bunnie Pion, NP  lisinopril-hydrochlorothiazide (PRINZIDE,ZESTORETIC) 10-12.5 MG per tablet Take 1 tablet by mouth daily.    Historical Provider, MD  Multiple Vitamin (MULTIVITAMIN WITH MINERALS) TABS Take 1 tablet by mouth daily.    Historical Provider, MD  naproxen (NAPROSYN) 500 MG tablet Take 1 tablet (500 mg total) by mouth 2 (two) times daily. 07/15/14   Hope Bunnie Pion, NP   BP 162/102 mmHg  Pulse 75  Temp(Src) 98.3 F  (36.8 C) (Oral)  Resp 16  Ht 5\' 11"  (1.803 m)  Wt 68.04 kg  BMI 20.93 kg/m2  SpO2 98% Physical Exam  Constitutional: He is oriented to person, place, and time. He appears well-developed and well-nourished. No distress.  HENT:  Head: Normocephalic and atraumatic.  Right Ear: Tympanic membrane normal.  Left Ear: Tympanic membrane normal.  Nose: Mucosal edema present. Epistaxis: left nostril.  Eyes: Conjunctivae and EOM are normal.  Neck: Normal range of motion. Neck supple.  Cardiovascular: Normal rate and regular rhythm.   Pulmonary/Chest: Effort normal and breath sounds normal.  Abdominal: He exhibits no distension.  Musculoskeletal: Normal range of motion.  Neurological: He is alert and oriented to person, place, and time. No cranial nerve deficit.  Skin: Skin is warm and dry.  Psychiatric: He has a normal mood and affect. His behavior is normal.  Nursing note and vitals reviewed.   ED Course  Procedures  Afrin nasal spray to each nostril  Patient re examined and there is decreased swelling of the nasal mucosa and the area of bleeding can be visualized. Area of bleeding cauterized with silver nitrite.   MDM  44 y.o. male with anterior bleed from left nostril stable for d/c without further bleeding. Discussed with the patient elevated BP and patient states that he has an appointment with his PCP this week. Discussed the the patient normal saline nose spray and humidifier to help lessen the chance of further  nose bleeds. Patient voices understanding and agrees with plan. Patient will use the Afrin Nasal spray BID for 3 days only.   Final diagnoses:  Left-sided epistaxis        Ashley Murrain, NP 04/24/15 [id number]  Varney Biles, MD 04/25/15 [phone]

## 2015-04-23 NOTE — ED Notes (Signed)
Pt states he was eating dinner at home and his nose started bleeding. Pt states his nose bled about an hour. Pt's nose is not bleeding at this time.

## 2016-10-18 ENCOUNTER — Emergency Department (HOSPITAL_COMMUNITY)
Admission: EM | Admit: 2016-10-18 | Discharge: 2016-10-18 | Disposition: A | Discharge status: Home / Self Care | Payer: Medicaid Other | Attending: Emergency Medicine | Admitting: Emergency Medicine

## 2016-10-18 ENCOUNTER — Encounter (HOSPITAL_COMMUNITY): Payer: Self-pay | Admitting: *Deleted

## 2016-10-18 DIAGNOSIS — T69021A Immersion foot, right foot, initial encounter: Secondary | ICD-10-CM | POA: Diagnosis not present

## 2016-10-18 DIAGNOSIS — S90821A Blister (nonthermal), right foot, initial encounter: Secondary | ICD-10-CM | POA: Diagnosis not present

## 2016-10-18 DIAGNOSIS — X31XXXA Exposure to excessive natural cold, initial encounter: Secondary | ICD-10-CM | POA: Insufficient documentation

## 2016-10-18 DIAGNOSIS — S8991XA Unspecified injury of right lower leg, initial encounter: Secondary | ICD-10-CM | POA: Diagnosis present

## 2016-10-18 DIAGNOSIS — Y999 Unspecified external cause status: Secondary | ICD-10-CM | POA: Insufficient documentation

## 2016-10-18 DIAGNOSIS — I10 Essential (primary) hypertension: Secondary | ICD-10-CM | POA: Insufficient documentation

## 2016-10-18 DIAGNOSIS — Y939 Activity, unspecified: Secondary | ICD-10-CM | POA: Insufficient documentation

## 2016-10-18 DIAGNOSIS — Y929 Unspecified place or not applicable: Secondary | ICD-10-CM | POA: Diagnosis not present

## 2016-10-18 DIAGNOSIS — T148XXA Other injury of unspecified body region, initial encounter: Secondary | ICD-10-CM

## 2016-10-18 LAB — CBG MONITORING, ED: Glucose-Capillary: 138 mg/dL — ABNORMAL HIGH (ref 65–99)

## 2016-10-18 MED ORDER — SULFAMETHOXAZOLE-TRIMETHOPRIM 800-160 MG PO TABS: 1.0000 | ORAL_TABLET | Freq: Two times a day (BID) | ORAL | 0 refills | Status: AC

## 2016-10-18 MED ORDER — SILVER SULFADIAZINE 1 % EX CREA: 1.0000 "application " | TOPICAL_CREAM | Freq: Two times a day (BID) | CUTANEOUS | 0 refills | Status: DC

## 2016-10-18 NOTE — ED Provider Notes (Signed)
[facility] DEPT Provider Note   CSN: [id number] Arrival date & time: 10/18/16  0421     History   Chief Complaint Chief Complaint  Patient presents with  . Toe Pain    HPI Chad Robinson is a 46 y.o. male.  Patient reports that he noticed a blister between his fourth and fifth toes on the right foot some time ago. He cannot remember when it was. The blister got bigger over a couple of days and then he popped it. Since then the area has been open and is not seeming to heal. He has not had any active drainage from the site. He denies diabetes. He reports that the area is not tender or painful.      Past Medical History:  Diagnosis Date  . Hypertension     There are no active problems to display for this patient.   History reviewed. No pertinent surgical history.     Home Medications    Prior to Admission medications   Medication Sig Start Date End Date Taking? Authorizing Provider  cyclobenzaprine (FLEXERIL) 10 MG tablet Take 1 tablet (10 mg total) by mouth 2 (two) times daily as needed for muscle spasms. 07/15/14   Ashley Murrain, NP  HYDROcodone-acetaminophen (NORCO/VICODIN) 5-325 MG per tablet Take 1 tablet by mouth every 4 (four) hours as needed. 07/15/14   Ashley Murrain, NP  lisinopril-hydrochlorothiazide (PRINZIDE,ZESTORETIC) 10-12.5 MG per tablet Take 1 tablet by mouth daily.    [provider]  Multiple Vitamin (MULTIVITAMIN WITH MINERALS) TABS Take 1 tablet by mouth daily.    [provider]  naproxen (NAPROSYN) 500 MG tablet Take 1 tablet (500 mg total) by mouth 2 (two) times daily. 07/15/14   Ashley Murrain, NP  silver sulfADIAZINE (SILVADENE) 1 % cream Apply 1 application topically 2 (two) times daily. 10/18/16   Orpah Greek, MD  sulfamethoxazole-trimethoprim (BACTRIM DS,SEPTRA DS) 800-160 MG tablet Take 1 tablet by mouth 2 (two) times daily. 10/18/16 10/25/16  Orpah Greek, MD    Family History History reviewed. No  pertinent family history.  Social History Social History  Substance Use Topics  . Smoking status: Never Smoker  . Smokeless tobacco: Never Used  . Alcohol use 2.4 oz/week    4 Cans of beer per week     Allergies   Fish allergy   Review of Systems Review of Systems  Skin: Positive for wound.  All other systems reviewed and are negative.    Physical Exam Updated Vital Signs BP (!) 170/86 (BP Location: Left Arm)   Pulse 84   Temp 97.8 F (36.6 C) (Oral)   Resp 20   Ht 5\' 11"  (1.803 m)   Wt 68 kg (150 lb)   SpO2 97%   BMI 20.92 kg/m   Physical Exam  Constitutional: He is oriented to person, place, and time. He appears well-developed and well-nourished. No distress.  HENT:  Head: Normocephalic and atraumatic.  Right Ear: Hearing normal.  Left Ear: Hearing normal.  Nose: Nose normal.  Mouth/Throat: Oropharynx is clear and moist and mucous membranes are normal.  Eyes: Conjunctivae and EOM are normal. Pupils are equal, round, and reactive to light.  Neck: Normal range of motion. Neck supple.  Cardiovascular: Regular rhythm, S1 normal and S2 normal.  Exam reveals no gallop and no friction rub.   No murmur heard. Pulmonary/Chest: Effort normal and breath sounds normal. No respiratory distress. He exhibits no tenderness.  Abdominal: Soft. Normal appearance and bowel  sounds are normal. There is no hepatosplenomegaly. There is no tenderness. There is no rebound, no guarding, no tenderness at McBurney's point and negative Murphy's sign. No hernia.  Musculoskeletal: Normal range of motion.  Neurological: He is alert and oriented to person, place, and time. He has normal strength. No cranial nerve deficit or sensory deficit. Coordination normal. GCS eye subscore is 4. GCS verbal subscore is 5. GCS motor subscore is 6.  Skin: Skin is warm, dry and intact. No rash noted. No cyanosis.  Ulcerated area between fourth and fifth toe on right foot just proximal to the toes with visible  granulation tissue present. No erythema or induration. Desquamation of tissue between fourth and fifth toes right foot.  Psychiatric: He has a normal mood and affect. His speech is normal and behavior is normal. Thought content normal.  Nursing note and vitals reviewed.    ED Treatments / Results  Labs (all labs ordered are listed, but only abnormal results are displayed) Labs Reviewed  CBG MONITORING, ED    EKG  EKG Interpretation None       Radiology No results found.  Procedures Debridement Date/Time: 10/18/2016 4:52 AM Performed by: Orpah Greek Authorized by: Orpah Greek  Consent: Verbal consent obtained. Risks and benefits: risks, benefits and alternatives were discussed Consent given by: patient Patient understanding: patient states understanding of the procedure being performed Patient consent: the patient's understanding of the procedure matches consent given Site marked: the operative site was marked Patient identity confirmed: verbally with patient Time out: Immediately prior to procedure a "time out" was called to verify the correct patient, procedure, equipment, support staff and site/side marked as required. Local anesthesia used: no  Anesthesia: Local anesthesia used: no  Sedation: Patient sedated: no Patient tolerance: Patient tolerated the procedure well with no immediate complications Comments: Several areas of dead skin were debrided with scissors and forceps between fourth and fifth toes    (including critical care time)  Medications Ordered in ED Medications - No data to display   Initial Impression / Assessment and Plan / ED Course  I have reviewed the triage vital signs and the nursing notes.  Pertinent labs & imaging results that were available during my care of the patient were reviewed by me and considered in my medical decision making (see chart for details).     Patient presents with ulcerated area that started  as a blister between his toes. The remainder of the foot appears healthy, no evidence of tinea infection. Area was debrided. There is good granulation tissue present. There is no surrounding erythema, swelling or induration to suggest any significant cellulitis. This area looks consistent with a very focal aspect of transient foot. He does wear boots in his feet stay wet during the day. Will treat orally with Bactrim and topically with Silvadene, follow-up with PCP one week for recheck. Keep area clean and dry.  CBG normal, no sign of diabetes.  Final Clinical Impressions(s) / ED Diagnoses   Final diagnoses:  Trench foot of right lower extremity, initial encounter    New Prescriptions New Prescriptions   SILVER SULFADIAZINE (SILVADENE) 1 % CREAM    Apply 1 application topically 2 (two) times daily.   SULFAMETHOXAZOLE-TRIMETHOPRIM (BACTRIM DS,SEPTRA DS) 800-160 MG TABLET    Take 1 tablet by mouth 2 (two) times daily.     Orpah Greek, MD 10/18/16 [phone]

## 2016-10-18 NOTE — ED Triage Notes (Signed)
Pt c/o place to right 5th toe; pt states a blister came up a few days ago and he popped the bump, pt states the place is now not healing; pt has open wound in between 4th and 5th toe, no drainage noted

## 2017-01-02 ENCOUNTER — Encounter (HOSPITAL_COMMUNITY): Payer: Self-pay | Admitting: Emergency Medicine

## 2017-01-02 ENCOUNTER — Emergency Department (HOSPITAL_COMMUNITY)
Admission: EM | Admit: 2017-01-02 | Discharge: 2017-01-02 | Disposition: A | Discharge status: Home / Self Care | Payer: Medicaid Other | Attending: Emergency Medicine | Admitting: Emergency Medicine

## 2017-01-02 DIAGNOSIS — Z79899 Other long term (current) drug therapy: Secondary | ICD-10-CM | POA: Diagnosis not present

## 2017-01-02 DIAGNOSIS — I10 Essential (primary) hypertension: Secondary | ICD-10-CM | POA: Diagnosis not present

## 2017-01-02 DIAGNOSIS — M79671 Pain in right foot: Secondary | ICD-10-CM | POA: Diagnosis present

## 2017-01-02 DIAGNOSIS — B353 Tinea pedis: Secondary | ICD-10-CM | POA: Diagnosis not present

## 2017-01-02 DIAGNOSIS — M79672 Pain in left foot: Secondary | ICD-10-CM | POA: Diagnosis not present

## 2017-01-02 LAB — BASIC METABOLIC PANEL
Anion gap: 6 (ref 5–15)
BUN: 13 mg/dL (ref 6–20)
CHLORIDE: 106 mmol/L (ref 101–111)
CO2: 28 mmol/L (ref 22–32)
Calcium: 9.1 mg/dL (ref 8.9–10.3)
Creatinine, Ser: 0.8 mg/dL (ref 0.61–1.24)
GFR calc Af Amer: >60 mL/min (ref >60)
GFR calc non Af Amer: >60 mL/min (ref >60)
Glucose, Bld: 116 mg/dL — ABNORMAL HIGH (ref 65–99)
Potassium: 3.7 mmol/L (ref 3.5–5.1)
SODIUM: 140 mmol/L (ref 135–145)

## 2017-01-02 LAB — CBG MONITORING, ED: GLUCOSE-CAPILLARY: 118 mg/dL — AB (ref 65–99)

## 2017-01-02 LAB — CK: CK TOTAL: 449 U/L — AB (ref 49–397)

## 2017-01-02 MED ORDER — CLOTRIMAZOLE 1 % EX CREA: TOPICAL_CREAM | CUTANEOUS | 0 refills | Status: DC

## 2017-01-02 NOTE — ED Notes (Signed)
CBG 118 

## 2017-01-02 NOTE — ED Provider Notes (Signed)
[facility] DEPT Provider Note   CSN: [id number] Arrival date & time: 01/02/17  5638     History   Chief Complaint Chief Complaint  Patient presents with  . Foot Pain    HPI Chad Robinson is a 46 y.o. male.  Patient presents with one month of bilateral foot burning and pain. Reports this is constant. He came in tonight because he couldn't sleep. It is worse with ambulation. Feet also feel "hot". Denies any trauma but states he walks a lot. No fever. No vomiting or diarrhea. He went to the drugstore and put on some unknown powder without relief. Denies any history of diabetes.   The history is provided by the patient.  Foot Pain  Pertinent negatives include no chest pain, no abdominal pain, no headaches and no shortness of breath.    Past Medical History:  Diagnosis Date  . Hypertension     There are no active problems to display for this patient.   History reviewed. No pertinent surgical history.     Home Medications    Prior to Admission medications   Medication Sig Start Date End Date Taking? Authorizing Provider  amlodipine-atorvastatin (CADUET) 10-10 MG tablet Take 1 tablet by mouth daily.   Yes [provider]  lisinopril-hydrochlorothiazide (PRINZIDE,ZESTORETIC) 20-12.5 MG tablet Take 1 tablet by mouth daily.   Yes [provider]  meloxicam (MOBIC) 7.5 MG tablet Take 7.5 mg by mouth daily.   Yes [provider]  cyclobenzaprine (FLEXERIL) 10 MG tablet Take 1 tablet (10 mg total) by mouth 2 (two) times daily as needed for muscle spasms. 07/15/14   Ashley Murrain, NP  HYDROcodone-acetaminophen (NORCO/VICODIN) 5-325 MG per tablet Take 1 tablet by mouth every 4 (four) hours as needed. 07/15/14   Ashley Murrain, NP  Multiple Vitamin (MULTIVITAMIN WITH MINERALS) TABS Take 1 tablet by mouth daily.    [provider]  naproxen (NAPROSYN) 500 MG tablet Take 1 tablet (500 mg total) by mouth 2 (two) times daily. 07/15/14   Ashley Murrain, NP  silver sulfADIAZINE (SILVADENE) 1 % cream Apply 1 application topically 2 (two) times daily. 10/18/16   Orpah Greek, MD    Family History History reviewed. No pertinent family history.  Social History Social History  Substance Use Topics  . Smoking status: Never Smoker  . Smokeless tobacco: Never Used  . Alcohol use 2.4 oz/week    4 Cans of beer per week     Allergies   Fish allergy   Review of Systems Review of Systems  Constitutional: Negative for activity change, appetite change, fatigue and fever.  HENT: Negative for congestion and rhinorrhea.   Respiratory: Negative for cough, chest tightness and shortness of breath.   Cardiovascular: Negative for chest pain.  Gastrointestinal: Negative for abdominal pain, nausea and vomiting.  Genitourinary: Negative for dysuria, hematuria, testicular pain and urgency.  Musculoskeletal: Positive for arthralgias and myalgias. Negative for back pain and neck pain.  Skin: Negative for rash.  Neurological: Negative for dizziness, weakness and headaches.   all other systems are negative except as noted in the HPI and PMH.     Physical Exam Updated Vital Signs BP (!) 164/90 (BP Location: Left Arm)   Pulse 82   Temp 98 F (36.7 C) (Oral)   Resp 18   Ht 5\' 11"  (1.803 m)   Wt 68 kg (150 lb)   SpO2 98%   BMI 20.92 kg/m   Physical Exam  Constitutional: He is  oriented to person, place, and time. He appears well-developed and well-nourished. No distress.  HENT:  Head: Normocephalic and atraumatic.  Mouth/Throat: Oropharynx is clear and moist. No oropharyngeal exudate.  Eyes: Pupils are equal, round, and reactive to light. Conjunctivae and EOM are normal.  Neck: Normal range of motion. Neck supple.  No meningismus.  Cardiovascular: Normal rate, regular rhythm, normal heart sounds and intact distal pulses.   No murmur heard. Pulmonary/Chest: Effort normal and breath sounds normal. No respiratory distress.  Abdominal:  Soft. There is no tenderness. There is no rebound and no guarding.  Musculoskeletal: Normal range of motion. He exhibits no edema or tenderness.  Bilateral feet appear normal without erythema or edema. Intact DP and PT pulses.  Slight maceration between toes of right foot.  Neurological: He is alert and oriented to person, place, and time. No cranial nerve deficit. He exhibits normal muscle tone. Coordination normal.  No ataxia on finger to nose bilaterally. No pronator drift. 5/5 strength throughout. CN 2-12 intact.Equal grip strength. Sensation intact.   Skin: Skin is warm.  Psychiatric: He has a normal mood and affect. His behavior is normal.  Nursing note and vitals reviewed.    ED Treatments / Results  Labs (all labs ordered are listed, but only abnormal results are displayed) Labs Reviewed  BASIC METABOLIC PANEL - Abnormal; Notable for the following:       Result Value   Glucose, Bld 116 (*)    All other components within normal limits  CK - Abnormal; Notable for the following:    Total CK 449 (*)    All other components within normal limits  CBG MONITORING, ED - Abnormal; Notable for the following:    Glucose-Capillary 118 (*)    All other components within normal limits    EKG  EKG Interpretation None       Radiology No results found.  Procedures Procedures (including critical care time)  Medications Ordered in ED Medications - No data to display   Initial Impression / Assessment and Plan / ED Course  I have reviewed the triage vital signs and the nursing notes.  Pertinent labs & imaging results that were available during my care of the patient were reviewed by me and considered in my medical decision making (see chart for details).     Bilateral foot pain. Neurovascularly intact. CBG 118.  Labs reassuring.  CK minimally elevated.  Will treat with antifungal. Keep feet dry and clean. Follow up with PCP and podiatry.  Return precautions  discussed.   Final Clinical Impressions(s) / ED Diagnoses   Final diagnoses:  Bilateral foot pain  Tinea pedis of both feet    New Prescriptions New Prescriptions   No medications on file     Ezequiel Essex, MD 01/02/17 [phone]

## 2017-01-02 NOTE — ED Triage Notes (Signed)
Pt c/o burning sensation in both feet for several months that worsens with ambulation, feet also feel hot

## 2017-07-13 ENCOUNTER — Emergency Department (HOSPITAL_COMMUNITY)
Admission: EM | Admit: 2017-07-13 | Discharge: 2017-07-13 | Disposition: A | Discharge status: Home / Self Care | Payer: Medicaid Other | Attending: Emergency Medicine | Admitting: Emergency Medicine

## 2017-07-13 ENCOUNTER — Encounter (HOSPITAL_COMMUNITY): Payer: Self-pay | Admitting: Emergency Medicine

## 2017-07-13 ENCOUNTER — Other Ambulatory Visit: Payer: Self-pay

## 2017-07-13 ENCOUNTER — Emergency Department (HOSPITAL_COMMUNITY): Payer: Medicaid Other

## 2017-07-13 DIAGNOSIS — Z79899 Other long term (current) drug therapy: Secondary | ICD-10-CM | POA: Insufficient documentation

## 2017-07-13 DIAGNOSIS — I10 Essential (primary) hypertension: Secondary | ICD-10-CM | POA: Diagnosis not present

## 2017-07-13 DIAGNOSIS — M25561 Pain in right knee: Secondary | ICD-10-CM | POA: Insufficient documentation

## 2017-07-13 DIAGNOSIS — M25562 Pain in left knee: Secondary | ICD-10-CM | POA: Insufficient documentation

## 2017-07-13 MED ORDER — PREDNISONE 10 MG PO TABS: 20.0000 mg | ORAL_TABLET | Freq: Two times a day (BID) | ORAL | 0 refills | Status: DC

## 2017-07-13 NOTE — ED Triage Notes (Signed)
Pt c/o bilateral knee pain for a while. Pt denies any injury.

## 2017-07-13 NOTE — ED Provider Notes (Signed)
[facility] EMERGENCY DEPARTMENT Provider Note   CSN: [id number] Arrival date & time: 07/13/17  0241     History   Chief Complaint Chief Complaint  Patient presents with  . Knee Pain    HPI Chad Robinson is a 47 y.o. male.  Patient is a 47 year old male presenting with bilateral knee pain.  This began 2 days ago in the absence of any injury or trauma.  He denies any new workouts or exercises.  He denies any fevers or chills.   The history is provided by the patient.  Knee Pain   This is a new problem. The current episode started 2 days ago. The problem occurs constantly. The problem has been gradually worsening. Pain location: biLateral knees. The pain is moderate. He has tried nothing for the symptoms.    Past Medical History:  Diagnosis Date  . Hypertension     There are no active problems to display for this patient.   History reviewed. No pertinent surgical history.     Home Medications    Prior to Admission medications   Medication Sig Start Date End Date Taking? Authorizing Provider  amlodipine-atorvastatin (CADUET) 10-10 MG tablet Take 1 tablet by mouth daily.   Yes [provider]  clotrimazole (LOTRIMIN) 1 % cream Apply to affected area 2 times daily 01/02/17   Rancour, Annie Main, MD  cyclobenzaprine (FLEXERIL) 10 MG tablet Take 1 tablet (10 mg total) by mouth 2 (two) times daily as needed for muscle spasms. 07/15/14   Ashley Murrain, NP  HYDROcodone-acetaminophen (NORCO/VICODIN) 5-325 MG per tablet Take 1 tablet by mouth every 4 (four) hours as needed. 07/15/14   Ashley Murrain, NP  lisinopril-hydrochlorothiazide (PRINZIDE,ZESTORETIC) 20-12.5 MG tablet Take 1 tablet by mouth daily.    [provider]  meloxicam (MOBIC) 7.5 MG tablet Take 7.5 mg by mouth daily.    [provider]  Multiple Vitamin (MULTIVITAMIN WITH MINERALS) TABS Take 1 tablet by mouth daily.    [provider]  naproxen (NAPROSYN) 500 MG tablet Take 1  tablet (500 mg total) by mouth 2 (two) times daily. 07/15/14   Ashley Murrain, NP  silver sulfADIAZINE (SILVADENE) 1 % cream Apply 1 application topically 2 (two) times daily. 10/18/16   Orpah Greek, MD    Family History No family history on file.  Social History Social History   Tobacco Use  . Smoking status: Never Smoker  . Smokeless tobacco: Never Used  Substance Use Topics  . Alcohol use: Yes    Alcohol/week: 2.4 oz    Types: 4 Cans of beer per week  . Drug use: No     Allergies   Fish allergy   Review of Systems Review of Systems  All other systems reviewed and are negative.    Physical Exam Updated Vital Signs BP (!) 160/116 (BP Location: Right Arm)   Pulse (!) 108   Temp 97.9 F (36.6 C) (Oral)   Resp 17   Ht 5\' 11"  (1.803 m)   Wt 90.7 kg (200 lb)   SpO2 97%   BMI 27.89 kg/m   Physical Exam  Constitutional: He is oriented to person, place, and time. He appears well-developed and well-nourished. No distress.  HENT:  Head: Normocephalic and atraumatic.  Neck: Normal range of motion. Neck supple.  Musculoskeletal:  Both knees appear grossly normal.  There are no effusions or crepitus.  He has good range of motion with some discomfort at full extension.  Anterior and  posterior drawer tests are negative.  There is no laxity to varus or valgus stress.  Neurological: He is alert and oriented to person, place, and time.  Skin: Skin is warm and dry. He is not diaphoretic.  Nursing note and vitals reviewed.    ED Treatments / Results  Labs (all labs ordered are listed, but only abnormal results are displayed) Labs Reviewed - No data to display  EKG  EKG Interpretation None       Radiology Dg Knee 2 Views Left  Result Date: 07/13/2017 CLINICAL DATA:  47 y/o  M; anterior bilateral knee pain. EXAM: LEFT KNEE - 1-2 VIEW COMPARISON:  09/07/2012 left knee MRI. FINDINGS: No acute fracture, dislocation, or joint effusion. Superior and inferior  patellar enthesophytes. Small patellofemoral compartment periarticular osteophytes. Joint spaces are otherwise maintained. IMPRESSION: 1. No acute bony or articular abnormality identified. 2. Mild patellofemoral compartment osteoarthrosis. Electronically Signed   By: Kristine Garbe M.D.   On: 07/13/2017 03:36   Dg Knee 2 Views Right  Result Date: 07/13/2017 CLINICAL DATA:  47 year old male with bilateral knee pain. EXAM: RIGHT KNEE - 1-2 VIEW COMPARISON:  Left knee radiograph dated 07/13/2017 FINDINGS: There is no acute fracture or dislocation. Mild arthritic changes primarily involving the medial compartment. No significant joint effusion. The soft tissues appear unremarkable. IMPRESSION: 1. No acute fracture or dislocation. 2. Mild arthritic changes. Electronically Signed   By: Anner Crete M.D.   On: 07/13/2017 03:33    Procedures Procedures (including critical care time)  Medications Ordered in ED Medications - No data to display   Initial Impression / Assessment and Plan / ED Course  I have reviewed the triage vital signs and the nursing notes.  Pertinent labs & imaging results that were available during my care of the patient were reviewed by me and considered in my medical decision making (see chart for details).  X-rays show mild osteoarthritis which I suspect is the cause of his pain.  He will be treated with a course of prednisone and is to follow-up with his primary doctor if not improving.  Final Clinical Impressions(s) / ED Diagnoses   Final diagnoses:  None    ED Discharge Orders    None       Veryl Speak, MD 07/13/17 [phone]

## 2017-07-13 NOTE — Discharge Instructions (Signed)
Prednisone as prescribed.  Ibuprofen 600 mg every 6 hours as needed for pain.  Follow-up with your primary doctor if not improving in the next 3-4 days.

## 2018-01-19 ENCOUNTER — Emergency Department (HOSPITAL_COMMUNITY)
Admission: EM | Admit: 2018-01-19 | Discharge: 2018-01-19 | Disposition: A | Discharge status: Home / Self Care | Payer: Medicaid Other | Attending: Emergency Medicine | Admitting: Emergency Medicine

## 2018-01-19 ENCOUNTER — Encounter (HOSPITAL_COMMUNITY): Payer: Self-pay | Admitting: Emergency Medicine

## 2018-01-19 ENCOUNTER — Other Ambulatory Visit: Payer: Self-pay

## 2018-01-19 DIAGNOSIS — R21 Rash and other nonspecific skin eruption: Secondary | ICD-10-CM | POA: Insufficient documentation

## 2018-01-19 DIAGNOSIS — W57XXXA Bitten or stung by nonvenomous insect and other nonvenomous arthropods, initial encounter: Secondary | ICD-10-CM | POA: Insufficient documentation

## 2018-01-19 DIAGNOSIS — Z79899 Other long term (current) drug therapy: Secondary | ICD-10-CM | POA: Diagnosis not present

## 2018-01-19 DIAGNOSIS — I10 Essential (primary) hypertension: Secondary | ICD-10-CM | POA: Insufficient documentation

## 2018-01-19 MED ORDER — HYDROCORTISONE 1 % EX CREA: TOPICAL_CREAM | CUTANEOUS | 0 refills | Status: DC

## 2018-01-19 MED ORDER — PERMETHRIN 5 % EX CREA: TOPICAL_CREAM | CUTANEOUS | 0 refills | Status: DC

## 2018-01-19 NOTE — Discharge Instructions (Addendum)
It appears you are being bitten by something.  Use the cream as prescribed.  Wash all of your clothing, bedding and towels.  Follow-up with your doctor.  Return to the ED if you develop difficulty breathing, difficulty swallowing or other concerns.

## 2018-01-19 NOTE — ED Notes (Signed)
Pt states he has not taken any OTC medications or ointments.

## 2018-01-19 NOTE — ED Provider Notes (Signed)
[facility] EMERGENCY DEPARTMENT Provider Note   CSN: [id number] Arrival date & time: 01/19/18  0251     History   Chief Complaint Chief Complaint  Patient presents with  . Rash    HPI Chad Robinson is a 47 y.o. male.  Patient presents with itchy bumps diffusely on his body that he states started about a week ago.  The bumps are itchy.  He is not tried to take anything for them.  He stopped his blood pressure medication as he thought it could be causing this.  Denies any fevers, chills, nausea or vomiting.  No difficulty breathing or difficulty swallowing.  No chest pain or shortness of breath.  No one else is similar bumps at home.  Denies seeing any bugs but has been mowing the lawn.  No camping trips or tick bites.  Denies seeing any bedbugs or fleas.  There is no pets at home.  The history is provided by the patient.  Rash      Past Medical History:  Diagnosis Date  . Hypertension     There are no active problems to display for this patient.   History reviewed. No pertinent surgical history.      Home Medications    Prior to Admission medications   Medication Sig Start Date End Date Taking? Authorizing Provider  amlodipine-atorvastatin (CADUET) 10-10 MG tablet Take 1 tablet by mouth daily.   Yes [provider]  lisinopril-hydrochlorothiazide (PRINZIDE,ZESTORETIC) 20-12.5 MG tablet Take 1 tablet by mouth daily.   Yes [provider]  clotrimazole (LOTRIMIN) 1 % cream Apply to affected area 2 times daily 01/02/17   Sevannah Madia, Annie Main, MD  cyclobenzaprine (FLEXERIL) 10 MG tablet Take 1 tablet (10 mg total) by mouth 2 (two) times daily as needed for muscle spasms. 07/15/14   Ashley Murrain, NP  HYDROcodone-acetaminophen (NORCO/VICODIN) 5-325 MG per tablet Take 1 tablet by mouth every 4 (four) hours as needed. 07/15/14   Ashley Murrain, NP  hydrocortisone cream 1 % Apply to affected area 2 times daily 01/19/18   Jadarious Dobbins, Annie Main, MD  meloxicam (MOBIC) 7.5  MG tablet Take 7.5 mg by mouth daily.    [provider]  Multiple Vitamin (MULTIVITAMIN WITH MINERALS) TABS Take 1 tablet by mouth daily.    [provider]  naproxen (NAPROSYN) 500 MG tablet Take 1 tablet (500 mg total) by mouth 2 (two) times daily. 07/15/14   Ashley Murrain, NP  permethrin (ELIMITE) 5 % cream Apply to affected area once, entire body from neck down. Wash off in morning after 8 hours. 01/19/18   Pharell Rolfson, Annie Main, MD  predniSONE (DELTASONE) 10 MG tablet Take 2 tablets (20 mg total) by mouth 2 (two) times daily. 07/13/17   Veryl Speak, MD  silver sulfADIAZINE (SILVADENE) 1 % cream Apply 1 application topically 2 (two) times daily. 10/18/16   Orpah Greek, MD    Family History No family history on file.  Social History Social History   Tobacco Use  . Smoking status: Never Smoker  . Smokeless tobacco: Never Used  Substance Use Topics  . Alcohol use: Yes    Alcohol/week: 4.0 standard drinks    Types: 4 Cans of beer per week  . Drug use: No     Allergies   Fish allergy   Review of Systems Review of Systems  Skin: Positive for rash.   all other systems are negative except as noted in the HPI and PMH.     Physical  Exam Updated Vital Signs BP (!) 141/84 (BP Location: Right Arm)   Pulse 83   Temp 98.5 F (36.9 C) (Oral)   Resp 15   SpO2 97%   Physical Exam  Constitutional: He is oriented to person, place, and time. He appears well-developed and well-nourished. No distress.  HENT:  Head: Normocephalic and atraumatic.  Mouth/Throat: Oropharynx is clear and moist. No oropharyngeal exudate.  Eyes: Pupils are equal, round, and reactive to light. Conjunctivae and EOM are normal.  Neck: Normal range of motion. Neck supple.  No meningismus.  Cardiovascular: Normal rate, regular rhythm, normal heart sounds and intact distal pulses.  No murmur heard. Pulmonary/Chest: Effort normal and breath sounds normal. No respiratory distress. He  exhibits no tenderness.  Abdominal: Soft. There is no tenderness. There is no rebound and no guarding.  Musculoskeletal: Normal range of motion. He exhibits no edema or tenderness.  Neurological: He is alert and oriented to person, place, and time. No cranial nerve deficit. He exhibits normal muscle tone. Coordination normal.  No ataxia on finger to nose bilaterally. No pronator drift. 5/5 strength throughout. CN 2-12 intact.Equal grip strength. Sensation intact.   Skin: Skin is warm. Capillary refill takes less than 2 seconds. Rash noted.  Scattered erythematous papules to arms, legs, trunk, abdomen and back No oral lesions, no genital lesions No lesions between fingers or toes  Psychiatric: He has a normal mood and affect. His behavior is normal.  Nursing note and vitals reviewed.    ED Treatments / Results  Labs (all labs ordered are listed, but only abnormal results are displayed) Labs Reviewed - No data to display  EKG None  Radiology No results found.  Procedures Procedures (including critical care time)  Medications Ordered in ED Medications - No data to display   Initial Impression / Assessment and Plan / ED Course  I have reviewed the triage vital signs and the nursing notes.  Pertinent labs & imaging results that were available during my care of the patient were reviewed by me and considered in my medical decision making (see chart for details).    Rash consistent with likely bug bites. Consider fleas, chiggers, ants.   Scabies and bedbugs seem less likely.   Treat with elimite as well as hydrocortisone.  Has PCP appointment in 1 day.  No evidence of Kathreen Cosier syndrome or TEN.   Patient instructed on treatment at home.  Needs to wash clothing, bedding, towels. Instructed to restart his medications as this seems unrelated.  Follow-up with PCP.  Return precautions discussed  Final Clinical Impressions(s) / ED Diagnoses   Final diagnoses:  Bug bite,  initial encounter    ED Discharge Orders         Ordered    hydrocortisone cream 1 %     01/19/18 0322    permethrin (ELIMITE) 5 % cream     01/19/18 0322           Ezequiel Essex, MD 01/19/18 [phone]

## 2018-01-19 NOTE — ED Notes (Signed)
ED Provider at bedside. 

## 2018-01-19 NOTE — ED Triage Notes (Signed)
Pt C/O rash on his abdomen, back, and extremities. Pt states this problem began "weeks ago." Pt denies fevers.

## 2019-12-24 IMAGING — DX DG KNEE 1-2V*L*
2 series · 2 of 2 positions shown · non-contrast
Comparison: 09/07/2012 left knee MRI.

CLINICAL DATA: 46 y/o  M; anterior bilateral knee pain.

EXAM:
LEFT KNEE - 1-2 VIEW

[knee ap]
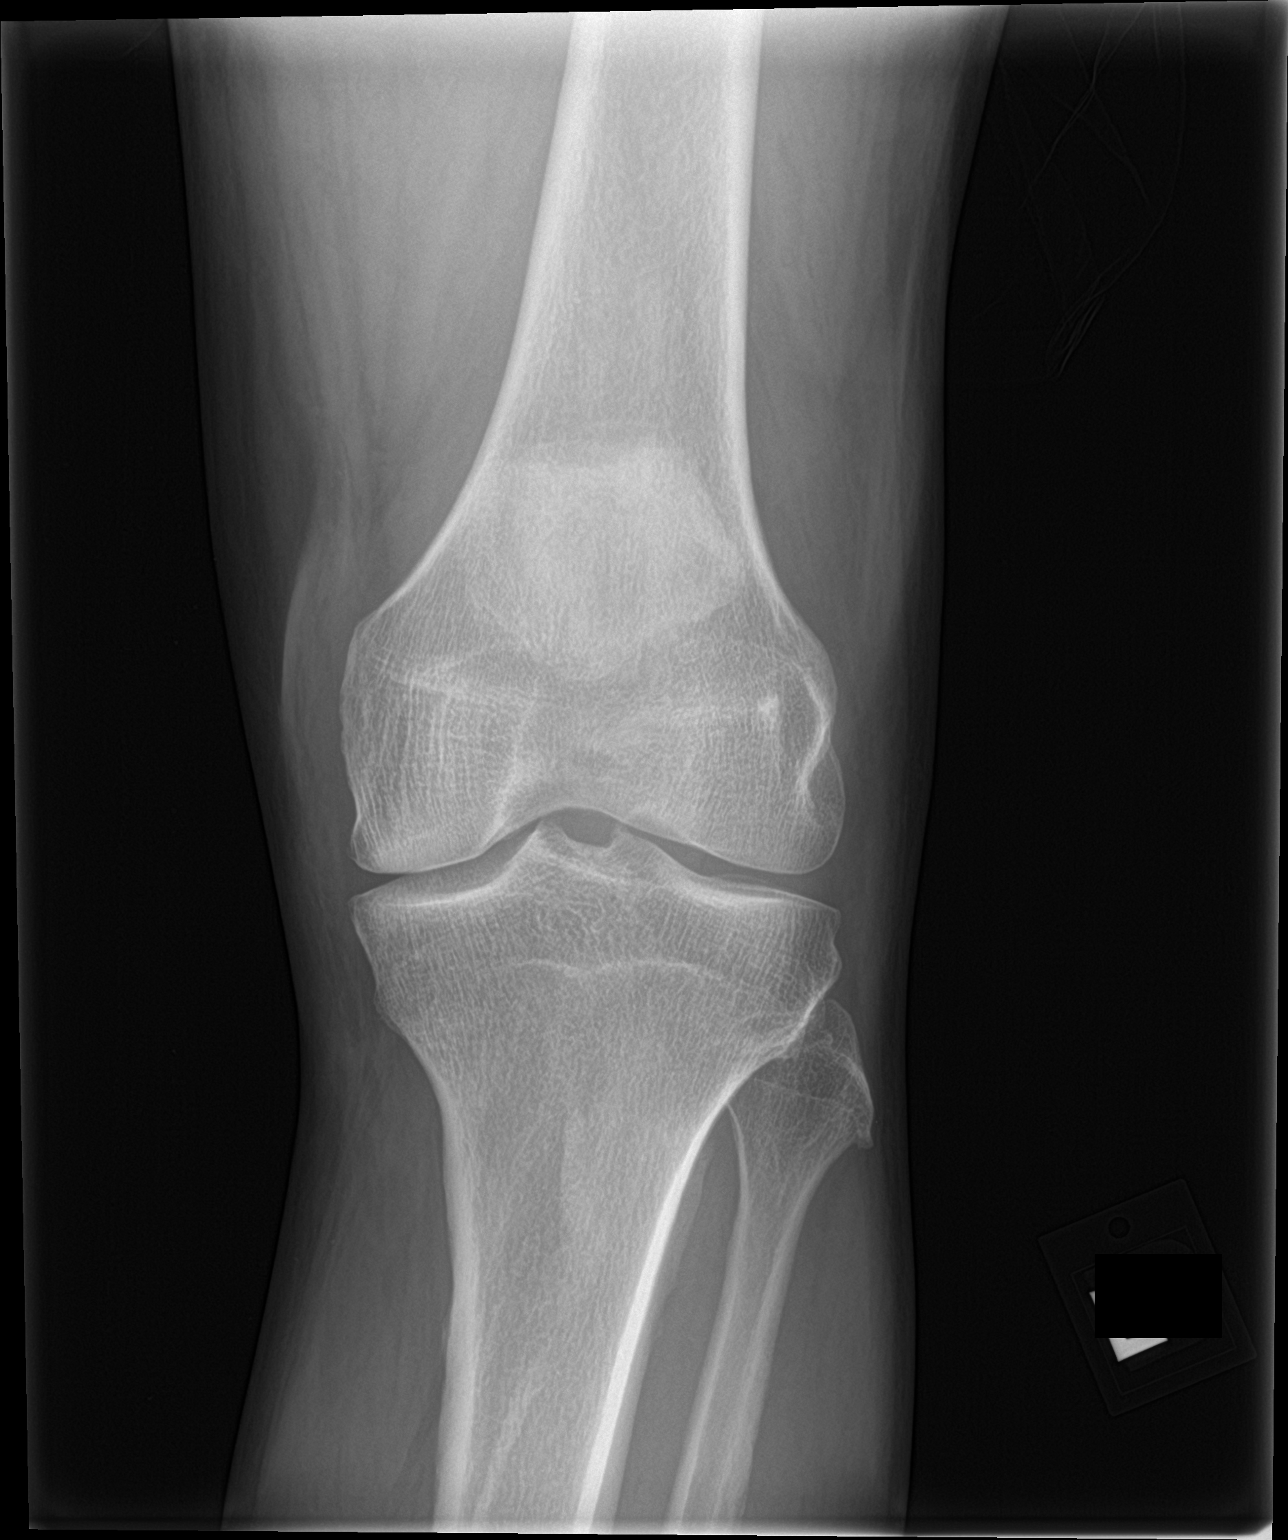

[knee lat]
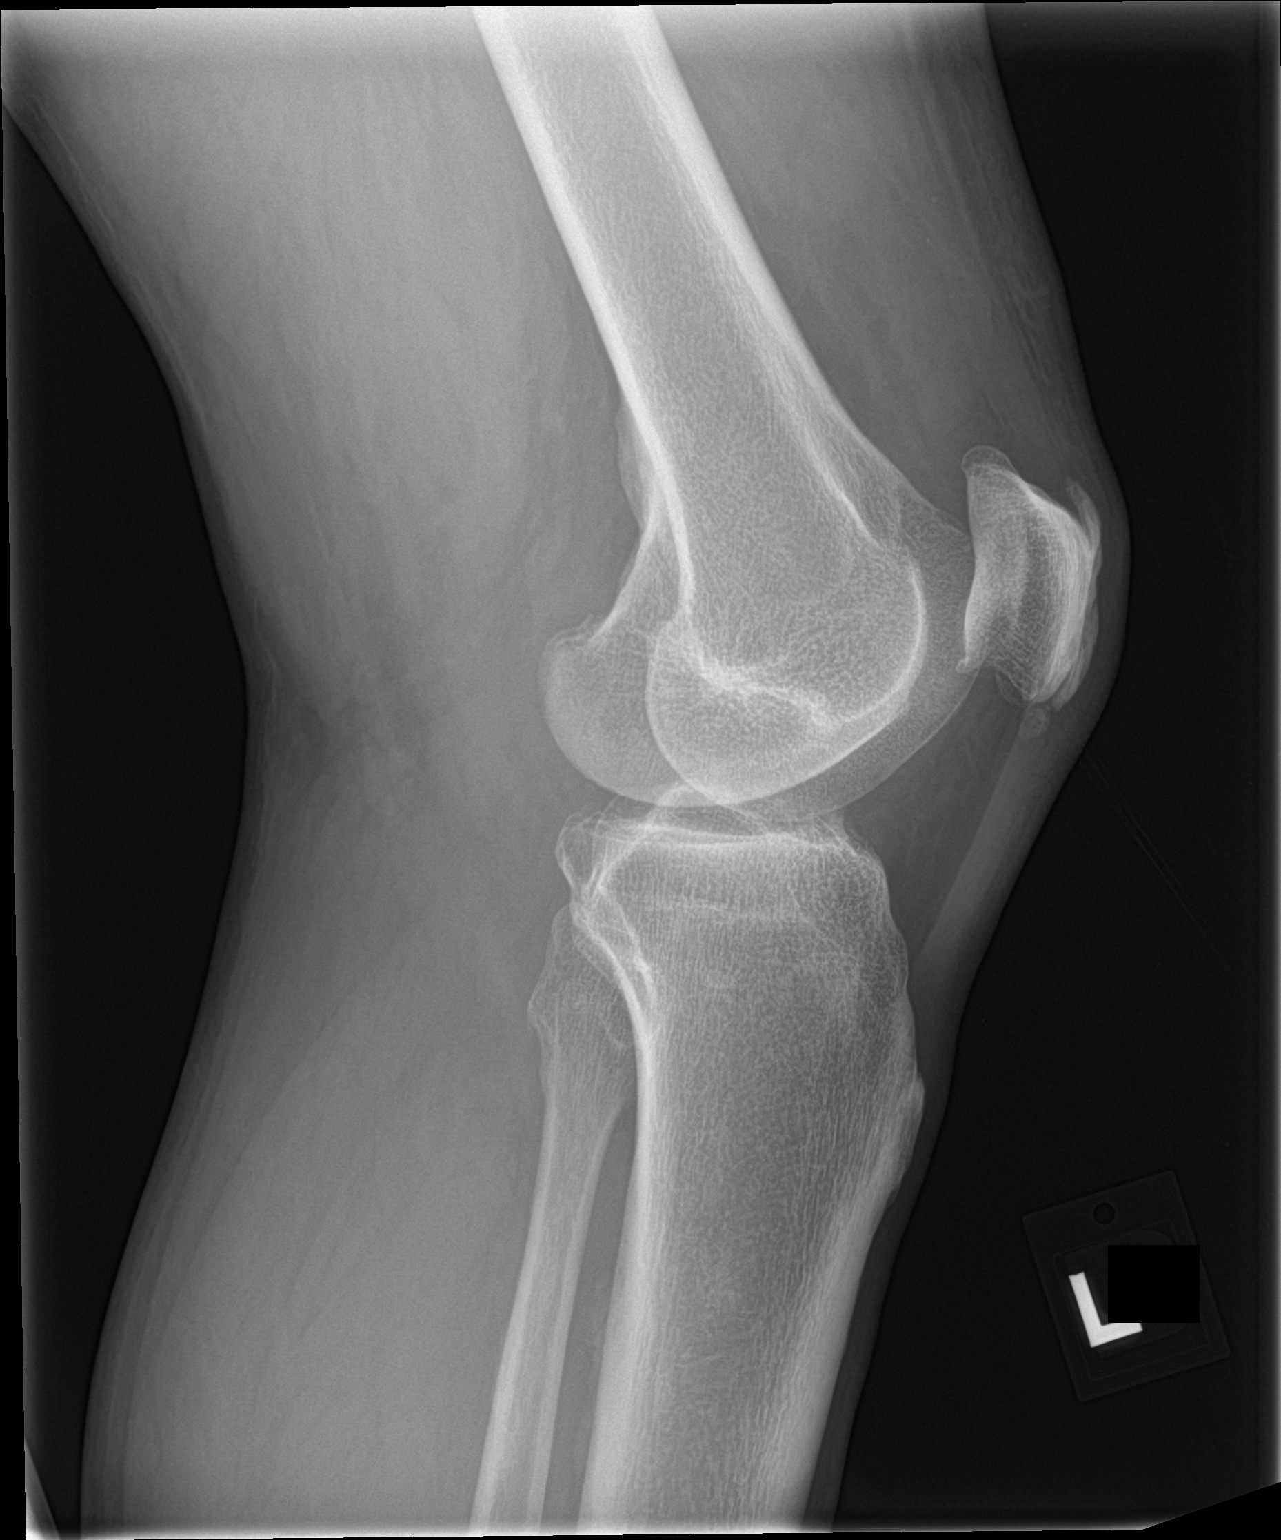

[2 of 2 positions shown; findings below may reference images not displayed]

FINDINGS: No acute fracture, dislocation, or joint effusion. Superior and
inferior patellar enthesophytes. Small patellofemoral compartment
periarticular osteophytes. Joint spaces are otherwise maintained.
IMPRESSION: 1. No acute bony or articular abnormality identified.
2. Mild patellofemoral compartment osteoarthrosis.

By: Nayeli Wada M.D.

## 2019-12-24 IMAGING — DX DG KNEE 1-2V*R*
2 series · 2 of 2 positions shown · non-contrast
Comparison: Left knee radiograph dated 07/13/2017

CLINICAL DATA: 46-year-old male with bilateral knee pain.

EXAM:
RIGHT KNEE - 1-2 VIEW

[knee ap]
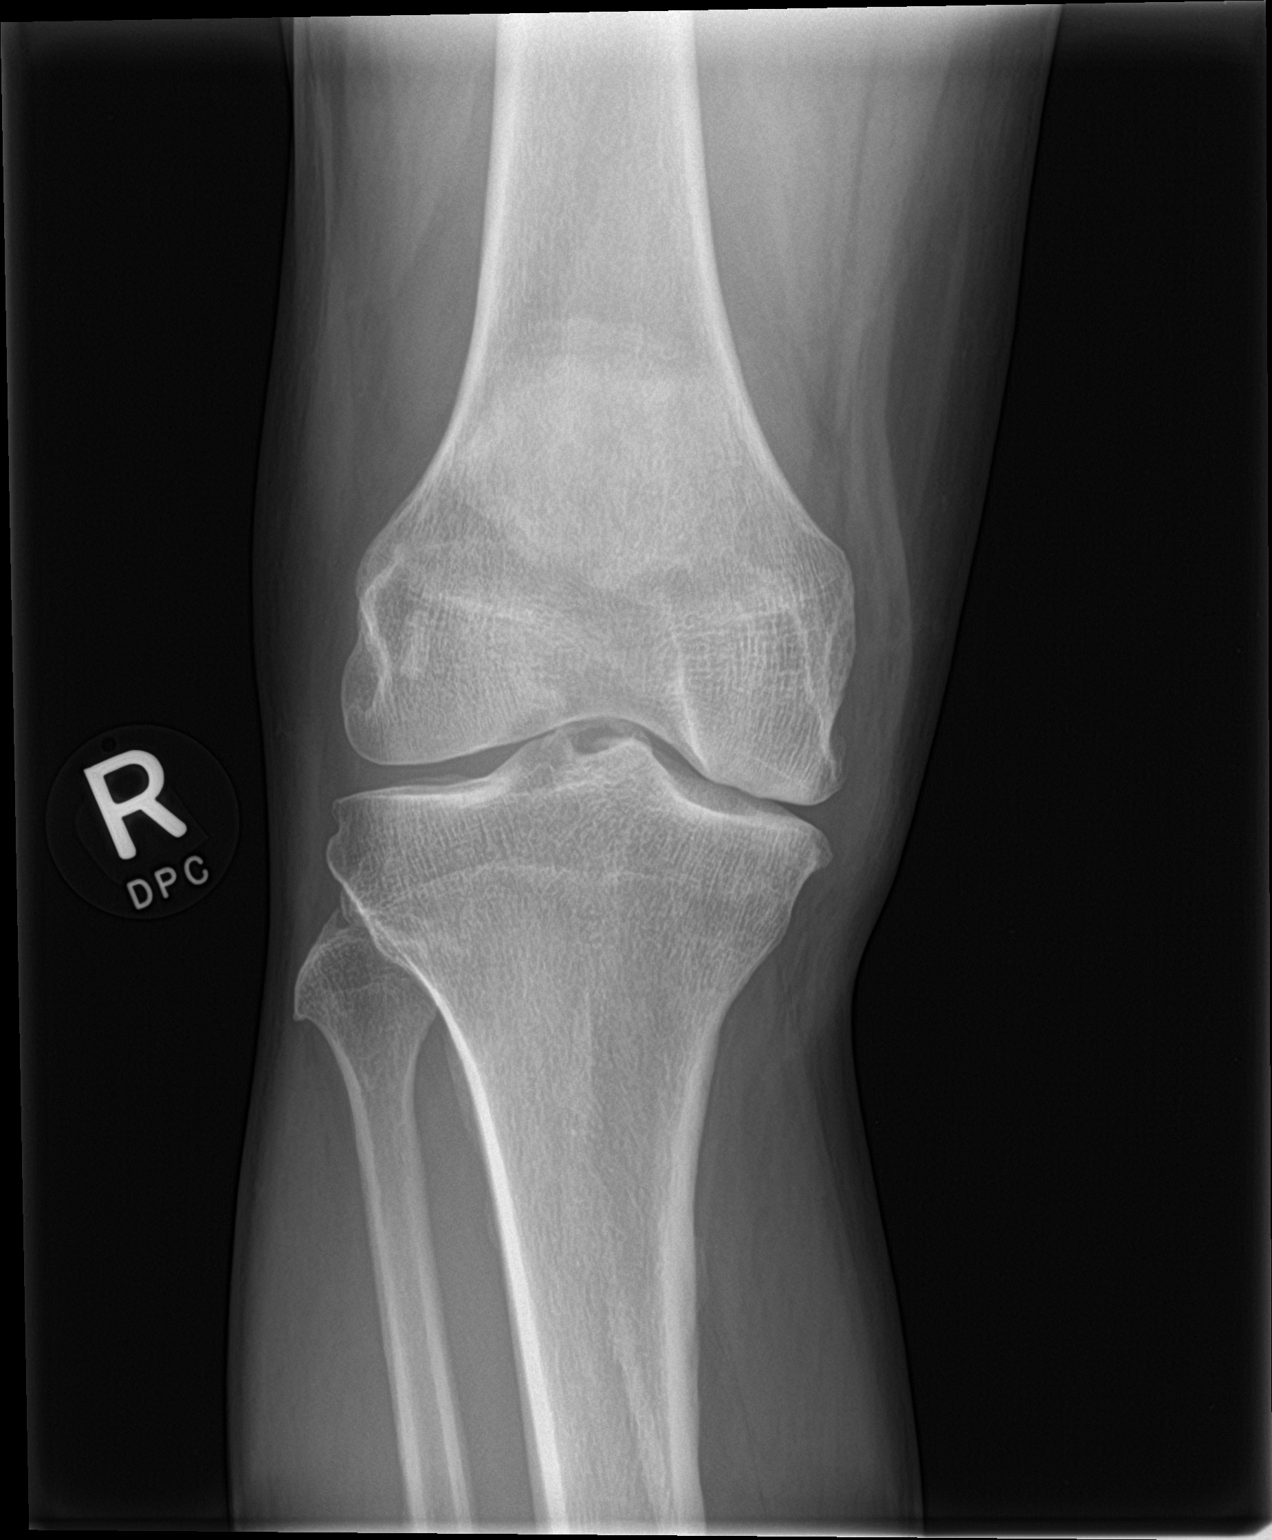

[knee lat]
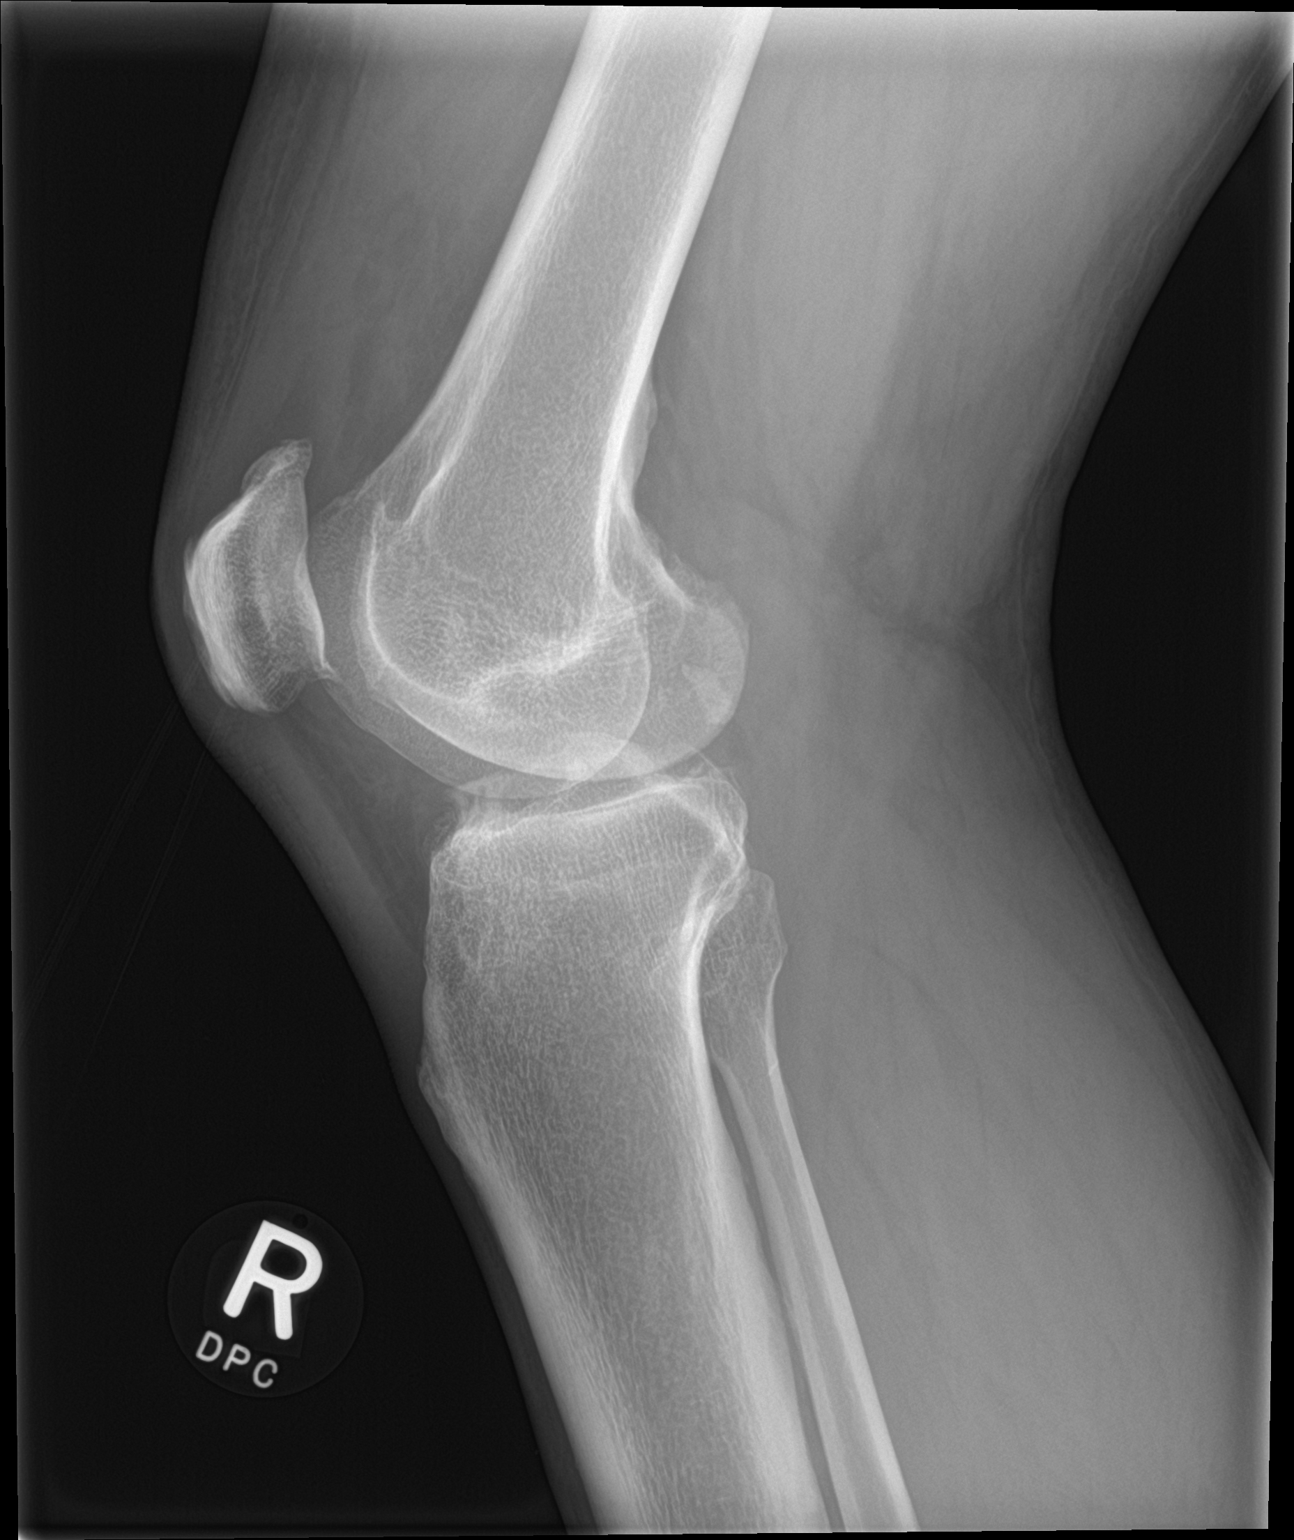

[2 of 2 positions shown; findings below may reference images not displayed]

FINDINGS: There is no acute fracture or dislocation. Mild arthritic changes
primarily involving the medial compartment. No significant joint
effusion. The soft tissues appear unremarkable.
IMPRESSION: 1. No acute fracture or dislocation.
2. Mild arthritic changes.

## 2020-07-31 ENCOUNTER — Encounter: Payer: Self-pay | Admitting: Internal Medicine

## 2020-09-09 ENCOUNTER — Ambulatory Visit (INDEPENDENT_AMBULATORY_CARE_PROVIDER_SITE_OTHER): Payer: Medicaid Other | Admitting: Nurse Practitioner

## 2020-09-09 ENCOUNTER — Encounter: Payer: Self-pay | Admitting: Nurse Practitioner

## 2020-09-09 ENCOUNTER — Telehealth: Payer: Self-pay

## 2020-09-09 ENCOUNTER — Other Ambulatory Visit: Payer: Self-pay

## 2020-09-09 DIAGNOSIS — Z0000 Encounter for general adult medical examination without abnormal findings: Secondary | ICD-10-CM

## 2020-09-09 DIAGNOSIS — Z1211 Encounter for screening for malignant neoplasm of colon: Secondary | ICD-10-CM

## 2020-09-09 MED ORDER — CLENPIQ 10-3.5-12 MG-GM -GM/160ML PO SOLN: 1.0000 | Freq: Once | ORAL | 0 refills | Status: AC

## 2020-09-09 NOTE — Telephone Encounter (Signed)
PA request form completed and faxed to [employer] for TCS. °

## 2020-09-09 NOTE — Patient Instructions (Signed)
Your health issues we discussed today were:   Need for colonoscopy: 1. I am glad you are doing well! 2. We will schedule your colonoscopy for you 3. Further recommendations will follow your colonoscopy 4. Call us if you have any questions or problems with the bowel prep prior to your procedure  Overall I recommend:  1. Continue other current medications 2. Return for follow-up based on recommendations made after your procedure, or as needed for GI symptoms   ---------------------------------------------------------------  I am glad you have gotten your COVID-19 vaccination!  Even though you are fully vaccinated you should continue to follow CDC and state/local guidelines.  ---------------------------------------------------------------   At [facility] we value your feedback. You may receive a survey about your visit today. Please share your experience as we strive to create trusting relationships with our patients to provide genuine, compassionate, quality care.  We appreciate your understanding and patience as we review any laboratory studies, imaging, and other diagnostic tests that are ordered as we care for you. Our office policy is 5 business days for review of these results, and any emergent or urgent results are addressed in a timely manner for your best interest. If you do not hear from our office in 1 week, please contact us.   We also encourage the use of MyChart, which contains your medical information for your review as well. If you are not enrolled in this feature, an access code is on this after visit summary for your convenience. Thank you for allowing [place] to be involved in your care.  It was great to see you today!  I hope you have a great summer!!

## 2020-09-09 NOTE — Progress Notes (Signed)
Primary Care Physician:  Chad Fire, MD Primary Gastroenterologist:  Dr. Gala Robinson  Chief Complaint  Patient presents with  . Colonoscopy    Never had TCS prior    HPI:   Chad Robinson is a 50 y.o. male who presents on referral from primary care to schedule colonoscopy.  Nurse/phone triage was deferred to office visit due to history of alcohol use likely necessitating augmented sedation.  No history of colonoscopy found in our system.  Today he states he doing okay overall.  Denies abdominal pain, N/V, hematochezia, melena, fever, chills, unintentional weight loss. Denies URI or flu-like symptoms. Denies loss of sense of taste or smell. The patient has received COVID-19 vaccination(s). Denies chest pain, dyspnea, dizziness, lightheadedness, syncope, near syncope. Denies any other upper or lower GI symptoms.  The patient admits to drinking 3-4 beers a day.  No history of recreational drug use.  Past Medical History:  Diagnosis Date  . Hypertension     Past Surgical History:  Procedure Laterality Date  . NO PAST SURGERIES      Current Outpatient Medications  Medication Sig Dispense Refill  . amLODipine (NORVASC) 10 MG tablet Take 1 tablet by mouth daily.    Marland Kitchen lisinopril-hydrochlorothiazide (ZESTORETIC) 20-25 MG tablet Take 1 tablet by mouth daily.     No current facility-administered medications for this visit.    Allergies as of 09/09/2020 - Review Complete 09/09/2020  Allergen Reaction Noted  . 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 Colon cancer Neg Hx     Social History   Socioeconomic History  . Marital status: Single    Spouse name: Not on file  . Number of children: Not on file  . Years of education: Not on file  . Highest education level: Not on file  Occupational History  . Not on file  Tobacco Use  . Smoking status: Former Smoker    Types: Cigarettes  . Smokeless tobacco: Never Used  Substance  and Sexual Activity  . Alcohol use: Yes    Alcohol/week: 3.0 - 4.0 standard drinks    Types: 3 - 4 Cans of beer per week    Comment: daily  . Drug use: No  . Sexual activity: Not on file  Other Topics Concern  . Not on file  Social History Narrative  . Not on file   Social Determinants of Health   Financial Resource Strain: Not on file  Food Insecurity: Not on file  Transportation Needs: Not on file  Physical Activity: Not on file  Stress: Not on file  Social Connections: Not on file  Intimate Partner Violence: Not on file    Subjective: Review of Systems  Constitutional: Negative for chills, fever, malaise/fatigue and weight loss.  HENT: Negative for congestion and sore throat.   Respiratory: Negative for cough and shortness of breath.   Cardiovascular: Negative for chest pain and palpitations.  Gastrointestinal: Negative for abdominal pain, blood in stool, diarrhea, melena, nausea and vomiting.  Musculoskeletal: Negative for joint pain and myalgias.  Skin: Negative for rash.  Neurological: Negative for dizziness and weakness.  Endo/Heme/Allergies: Does not bruise/bleed easily.  Psychiatric/Behavioral: Negative for depression. The patient is not nervous/anxious.   All other systems reviewed and are negative.      Objective: BP 140/66   Pulse 92   Temp (!) 97.5 F (36.4 C)   Ht _0  (1.803 m)   Wt 203 lb 12.8 oz (  92.4 kg)   BMI 28.42 kg/m  Physical Exam Vitals and nursing note reviewed.  Constitutional:      General: He is not in acute distress.    Appearance: Normal appearance. He is normal weight. He is not ill-appearing, toxic-appearing or diaphoretic.  HENT:     Head: Normocephalic and atraumatic.     Nose: No congestion or rhinorrhea.  Eyes:     General: No scleral icterus. Cardiovascular:     Rate and Rhythm: Normal rate and regular rhythm.     Heart sounds: Normal heart sounds.  Pulmonary:     Effort: Pulmonary effort is normal.     Breath  sounds: Normal breath sounds.  Abdominal:     General: Bowel sounds are normal. There is no distension.     Palpations: Abdomen is soft. There is no hepatomegaly, splenomegaly or mass.     Tenderness: There is no abdominal tenderness. There is no guarding or rebound.     Hernia: No hernia is present.  Musculoskeletal:     Cervical back: Neck supple.  Skin:    General: Skin is warm and dry.     Coloration: Skin is not jaundiced.     Findings: No bruising or rash.  Neurological:     General: No focal deficit present.     Mental Status: He is alert and oriented to person, place, and time. Mental status is at baseline.  Psychiatric:        Mood and Affect: Mood normal.        Behavior: Behavior normal.        Thought Content: Thought content normal.      Assessment:  Very pleasant 50 year old male presents to schedule first ever screening colonoscopy.  He is generally asymptomatic from a GI standpoint.  History of 3-4 beers a day, no recreational drugs.  Because of his daily alcohol consumption we will plan for the procedure on propofol.  Further recommendations will follow this procedure.   Proceed with colonoscopy by Dr. Gala Robinson in near future: the risks, benefits, and alternatives have been discussed with the patient in detail. The patient states understanding and desires to proceed.  Patient drinks 3-4 beers a day.  Denies recreational drugs. The patient is not on any anticoagulants, anxiolytics, chronic pain medications, antidepressants, antidiabetics, or iron supplements.  We will plan for the procedure on propofol/MAC to promote adequate sedation.   Plan: 1. Colonoscopy as described above 2. Further recommendation to follow 3. Follow-up based on post procedure recommendations or as needed for GI symptoms    Thank you for allowing [place] to participate in the care of [name], DNP, AGNP-C Adult & Gerontological Nurse Practitioner [facility]   09/09/2020 8:52 AM   Disclaimer: This note was dictated with voice recognition software. Similar sounding words can inadvertently be transcribed and may not be corrected upon review.

## 2020-09-11 ENCOUNTER — Other Ambulatory Visit: Payer: Self-pay

## 2020-09-11 NOTE — Telephone Encounter (Signed)
Received fax: [place] Medicaid fee schedule referenced for benefit coverage. This code [phone] would not require auth since being performed in an OP setting and both provider and facility are par.

## 2020-11-05 ENCOUNTER — Other Ambulatory Visit (HOSPITAL_COMMUNITY): Payer: Medicaid Other

## 2020-11-05 ENCOUNTER — Telehealth: Payer: Self-pay | Admitting: *Deleted

## 2020-11-05 NOTE — Telephone Encounter (Signed)
Patient called in to discuss his prep instructions. He did not know what am/pm meant. Discuss several times.

## 2020-11-07 ENCOUNTER — Ambulatory Visit (HOSPITAL_COMMUNITY)
Admission: RE | Admit: 2020-11-07 | Discharge: 2020-11-07 | Disposition: A | Discharge status: Home / Self Care | Payer: Medicaid Other | Attending: Internal Medicine | Admitting: Internal Medicine

## 2020-11-07 ENCOUNTER — Encounter (HOSPITAL_COMMUNITY)
Admission: RE | Disposition: A | Discharge status: Home / Self Care | Payer: Self-pay | Source: Home / Self Care | Attending: Internal Medicine

## 2020-11-07 ENCOUNTER — Encounter (HOSPITAL_COMMUNITY): Payer: Self-pay | Admitting: Internal Medicine

## 2020-11-07 ENCOUNTER — Other Ambulatory Visit: Payer: Self-pay

## 2020-11-07 ENCOUNTER — Ambulatory Visit (HOSPITAL_COMMUNITY): Payer: Medicaid Other | Admitting: Anesthesiology

## 2020-11-07 DIAGNOSIS — Z79899 Other long term (current) drug therapy: Secondary | ICD-10-CM | POA: Diagnosis not present

## 2020-11-07 DIAGNOSIS — K635 Polyp of colon: Secondary | ICD-10-CM | POA: Insufficient documentation

## 2020-11-07 DIAGNOSIS — Z1211 Encounter for screening for malignant neoplasm of colon: Secondary | ICD-10-CM | POA: Diagnosis present

## 2020-11-07 HISTORY — PX: POLYPECTOMY: SHX5525

## 2020-11-07 HISTORY — PX: COLONOSCOPY WITH PROPOFOL: SHX5780

## 2020-11-07 LAB — BASIC METABOLIC PANEL
Anion gap: 8 (ref 5–15)
Anion gap: 6 (ref 5–15)
BUN: 6 mg/dL (ref 6–20)
BUN: 6 mg/dL (ref 6–20)
CO2: 32 mmol/L (ref 22–32)
CO2: 31 mmol/L (ref 22–32)
Calcium: 9 mg/dL (ref 8.9–10.3)
Calcium: 8.7 mg/dL — ABNORMAL LOW (ref 8.9–10.3)
Chloride: 98 mmol/L (ref 98–111)
Chloride: 98 mmol/L (ref 98–111)
Creatinine, Ser: 0.75 mg/dL (ref 0.61–1.24)
Creatinine, Ser: 0.73 mg/dL (ref 0.61–1.24)
GFR, Estimated: >60 mL/min (ref >60)
GFR, Estimated: >60 mL/min (ref >60)
Glucose, Bld: 106 mg/dL — ABNORMAL HIGH (ref 70–99)
Glucose, Bld: 97 mg/dL (ref 70–99)
Potassium: 2.8 mmol/L — ABNORMAL LOW (ref 3.5–5.1)
Potassium: 3.2 mmol/L — ABNORMAL LOW (ref 3.5–5.1)
Sodium: 138 mmol/L (ref 135–145)
Sodium: 135 mmol/L (ref 135–145)

## 2020-11-07 SURGERY — COLONOSCOPY WITH PROPOFOL
Anesthesia: General

## 2020-11-07 MED ORDER — POTASSIUM CHLORIDE CRYS ER 20 MEQ PO TBCR
40.0000 meq | EXTENDED_RELEASE_TABLET | Freq: Once | ORAL | Status: AC
  Administered 2020-11-07: 40 meq via ORAL
  Filled 2020-11-07: qty 2

## 2020-11-07 MED ORDER — LACTATED RINGERS IV SOLN
INTRAVENOUS | Status: DC
  Administered 2020-11-07: 1000 mL via INTRAVENOUS

## 2020-11-07 MED ORDER — POTASSIUM CHLORIDE 10 MEQ/100ML IV SOLN
10.0000 meq | INTRAVENOUS | Status: AC
  Administered 2020-11-07 (×3): 10 meq via INTRAVENOUS
  Filled 2020-11-07: qty 100

## 2020-11-07 MED ORDER — STERILE WATER FOR IRRIGATION IR SOLN
Status: DC | PRN
  Administered 2020-11-07: 100 mL

## 2020-11-07 MED ORDER — PROPOFOL 10 MG/ML IV BOLUS
INTRAVENOUS | Status: DC | PRN
  Administered 2020-11-07: 100 mg via INTRAVENOUS
  Administered 2020-11-07: 125 ug/kg/min via INTRAVENOUS

## 2020-11-07 NOTE — Interval H&P Note (Signed)
History and Physical Interval Note:  11/07/2020 9:55 AM  [place]  has presented today for surgery, with the diagnosis of screening colonoscopy.  The various methods of treatment have been discussed with the patient and family. After consideration of risks, benefits and other options for treatment, the patient has consented to  Procedure(s) with comments: COLONOSCOPY WITH PROPOFOL (N/A) - 10:30am as a surgical intervention.  The patient's history has been reviewed, patient examined, no change in status, stable for surgery.  I have reviewed the patient's chart and labs.  Questions were answered to the patient's satisfaction.     Terrel Manalo  Done.  Potassium 2.8.  Discussed with anesthesia.  Will replete and perform colonoscopy later today.

## 2020-11-07 NOTE — Anesthesia Preprocedure Evaluation (Addendum)
Anesthesia Evaluation  Patient identified by MRN, date of birth, ID band Patient awake    Reviewed: Allergy & Precautions, NPO status , Patient's Chart, lab work & pertinent test results  Airway Mallampati: II  TM Distance: >3 FB Neck ROM: Full    Dental  (+) Dental Advisory Given   Pulmonary former smoker,    Pulmonary exam normal breath sounds clear to auscultation       Cardiovascular Exercise Tolerance: Good hypertension, Pt. on medications Normal cardiovascular exam Rhythm:Regular Rate:Normal     Neuro/Psych negative psych ROS   GI/Hepatic negative GI ROS, Neg liver ROS,   Endo/Other  negative endocrine ROS  Renal/GU negative Renal ROS     Musculoskeletal negative musculoskeletal ROS (+)   Abdominal   Peds  Hematology negative hematology ROS (+)   Anesthesia Other Findings Hypokalemia, potassium 2.8. after receiving oral and iv supplementation levels went to 3.2.  Reproductive/Obstetrics negative OB ROS                           Anesthesia Physical Anesthesia Plan  ASA: 2  Anesthesia Plan: General   Post-op Pain Management:    Induction: Intravenous  PONV Risk Score and Plan: Propofol infusion  Airway Management Planned: Nasal Cannula and Natural Airway  Additional Equipment:   Intra-op Plan:   Post-operative Plan:   Informed Consent: I have reviewed the patients History and Physical, chart, labs and discussed the procedure including the risks, benefits and alternatives for the proposed anesthesia with the patient or authorized representative who has indicated his/her understanding and acceptance.     Dental advisory given  Plan Discussed with: CRNA and Surgeon  Anesthesia Plan Comments:         Anesthesia Quick Evaluation

## 2020-11-07 NOTE — Anesthesia Postprocedure Evaluation (Signed)
Anesthesia Post Note  Patient: Chad Robinson  Procedure(s) Performed: COLONOSCOPY WITH PROPOFOL POLYPECTOMY  Patient location during evaluation: Endoscopy Anesthesia Type: General Level of consciousness: awake and alert and oriented Pain management: pain level controlled Vital Signs Assessment: post-procedure vital signs reviewed and stable Respiratory status: spontaneous breathing and respiratory function stable Cardiovascular status: blood pressure returned to baseline and stable Postop Assessment: no apparent nausea or vomiting Anesthetic complications: no   No notable events documented.   Last Vitals:  Vitals:   11/07/20 0909 11/07/20 1539  BP: 138/85 110/64  Pulse: 78 (!) 58  Resp: 15 17  Temp: 37.1 C (!) 36.4 C  SpO2: 97% 98%    Last Pain:  Vitals:   11/07/20 1539  TempSrc: Oral  PainSc:                  Srinidhi Landers C Abbigael Detlefsen

## 2020-11-07 NOTE — H&P (Signed)
@  EPPI@   Primary Care Physician:  Rosita Fire, MD Primary Gastroenterologist:  Dr. Gala Romney  Pre-Procedure History & Physical: HPI:  Chad Robinson is a 50 y.o. male is here for a screening colonoscopy.  No bowel symptoms.  No family history colon cancer.  No prior colonoscopy.  Past Medical History:  Diagnosis Date   Hypertension     Past Surgical History:  Procedure Laterality Date   NO PAST SURGERIES      Prior to Admission medications   Medication Sig Start Date End Date Taking? Authorizing Provider  amLODipine (NORVASC) 10 MG tablet Take 1 tablet by mouth daily. 09/05/20  Yes [provider]  CLENPIQ 10-3.5-12 MG-GM -GM/160ML SOLN Take 320 mLs by mouth as directed. 09/09/20  Yes [provider]  lisinopril-hydrochlorothiazide (ZESTORETIC) 20-25 MG tablet Take 1 tablet by mouth daily. 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    Family History  Problem Relation Age of Onset   Colon cancer Neg Hx     Social History   Socioeconomic History   Marital status: Single    Spouse name: Not on file   Number of children: Not on file   Years of education: Not on file   Highest education level: Not on file  Occupational History   Not on file  Tobacco Use   Smoking status: Former    Pack years: 0.00    Types: Cigarettes   Smokeless tobacco: Never  Vaping Use   Vaping Use: Never used  Substance and Sexual Activity   Alcohol use: Yes    Alcohol/week: 3.0 - 4.0 standard drinks    Types: 3 - 4 Cans of beer per week    Comment: daily   Drug use: Never   Sexual activity: Not on file  Other Topics Concern   Not on file  Social History Narrative   Not on file   Social Determinants of Health   Financial Resource Strain: Not on file  Food Insecurity: Not on file  Transportation Needs: Not on file  Physical Activity: Not on file   Stress: Not on file  Social Connections: Not on file  Intimate Partner Violence: Not on file    Review of Systems: See HPI, otherwise negative ROS  Physical Exam: BP 138/85   Pulse 78   Temp 98.8 F (37.1 C) (Oral)   Resp 15   Ht 5\' 11"  (1.803 m)   Wt 90.7 kg   SpO2 97%   BMI 27.89 kg/m  General:   Alert,  Well-developed, well-nourished, pleasant and cooperative in NAD Lungs:  Clear throughout to auscultation.   No wheezes, crackles, or rhonchi. No acute distress. Heart:  Regular rate and rhythm; no murmurs, clicks, rubs,  or gallops. Abdomen:  Soft, nontender and nondistended. No masses, hepatosplenomegaly or hernias noted. Normal bowel sounds, without guarding, and without rebound.    Impression/Plan: Chad Robinson is now here to undergo a screening colonoscopy.  First ever average rescreening examination.  Risks, benefits, limitations, imponderables and alternatives regarding colonoscopy have been reviewed with the patient. Questions have been answered. All parties agreeable.     Notice:  This dictation was prepared with Dragon dictation along with smaller phrase technology. Any transcriptional errors that result from this process are unintentional and may not be corrected upon review.

## 2020-11-07 NOTE — Interval H&P Note (Signed)
History and Physical Interval Note:  11/07/2020 3:15 PM  [place]  has presented today for surgery, with the diagnosis of screening colonoscopy.  The various methods of treatment have been discussed with the patient and family. After consideration of risks, benefits and other options for treatment, the patient has consented to  Procedure(s) with comments: COLONOSCOPY WITH PROPOFOL (N/A) - 10:30am as a surgical intervention.  The patient's history has been reviewed, patient examined, no change in status, stable for surgery.  I have reviewed the patient's chart and labs.  Questions were answered to the patient's satisfaction.     Chad Robinson   No change.  Potassium 2.8 this morning he was given additional IV potassium per anesthesia.  Potassium now 3.2; we will proceed with first-ever screening colonoscopy today per plan.  The risks, benefits, limitations, alternatives and imponderables have been reviewed with the patient. Questions have been answered. All parties are agreeable.

## 2020-11-07 NOTE — Transfer of Care (Signed)
2Immediate Anesthesia Transfer of Care Note  Patient: Chad Robinson  Procedure(s) Performed: COLONOSCOPY WITH PROPOFOL POLYPECTOMY  Patient Location: Endoscopy Unit  Anesthesia Type:General  Level of Consciousness: awake, alert , oriented and patient cooperative  Airway & Oxygen Therapy: Patient Spontanous Breathing  Post-op Assessment: Report given to RN, Post -op Vital signs reviewed and stable and Patient moving all extremities  Post vital signs: Reviewed and stable  Last Vitals:  Vitals Value Taken Time  BP    Temp    Pulse    Resp    SpO2      Last Pain:  Vitals:   11/07/20 1519  TempSrc:   PainSc: 0-No pain      Patients Stated Pain Goal:  (patient state he is unable to answer this question) (94/50/38 [id number])  Complications: No notable events documented.

## 2020-11-07 NOTE — OR Nursing (Signed)
Dr.Battula instructed me to call patient and have Mr. Urquilla to follow up with his primary Dr. Legrand Rams.  Mr. Fariss said he would call Dr Legrand Rams on Monday  Dr. Charna Elizabeth also encouraged patient to eat bannanas to keep his K+ elevated  patient left from hospital without incidence.

## 2020-11-07 NOTE — Op Note (Signed)
[facility] Patient Name: Chad Robinson Procedure Date: 11/07/2020 3:11 PM MRN: [id number] Date of Birth: November 17, 1970 Attending MD: Norvel Richards , MD CSN: [id number] Age: 50 Admit Type: Outpatient Procedure:                Colonoscopy Indications:              Screening for colorectal malignant neoplasm Providers:                Norvel Richards, MD, Lambert Mody, Nelma Rothman, Technician Referring MD:             Rosita Fire MD, MD Medicines:                Propofol per Anesthesia Complications:            No immediate complications. Estimated Blood Loss:     Estimated blood loss was minimal. Procedure:                Pre-Anesthesia Assessment:                           - Prior to the procedure, a History and Physical                            was performed, and patient medications and                            allergies were reviewed. The patient's tolerance of                            previous anesthesia was also reviewed. The risks                            and benefits of the procedure and the sedation                            options and risks were discussed with the patient.                            All questions were answered, and informed consent                            was obtained. Prior Anticoagulants: The patient has                            taken no previous anticoagulant or antiplatelet                            agents. ASA Grade Assessment: II - A patient with                            mild systemic disease. After reviewing the risks  and benefits, the patient was deemed in                            satisfactory condition to undergo the procedure.                           After obtaining informed consent, the colonoscope                            was passed under direct vision. Throughout the                            procedure, the patient's blood pressure, pulse, and                             oxygen saturations were monitored continuously. The                            CF-[id number] ([id number]) scope was introduced through                            the anus and advanced to the the cecum, identified                            by appendiceal orifice and ileocecal valve. The                            colonoscopy was performed without difficulty. The                            patient tolerated the procedure well. The quality                            of the bowel preparation was adequate. Scope In: 3:22:39 PM Scope Out: 3:33:47 PM Scope Withdrawal Time: 0 hours 9 minutes 5 seconds  Total Procedure Duration: 0 hours 11 minutes 8 seconds  Findings:      The perianal and digital rectal examinations were normal.      A 5 mm polyp was found in the recto-sigmoid colon. The polyp was       semi-pedunculated. The polyp was removed with a cold snare. Resection       and retrieval were complete. Estimated blood loss was minimal.      The exam was otherwise without abnormality on direct and retroflexion       views. Impression:               - One 5 mm polyp at the recto-sigmoid colon,                            removed with a cold snare. Resected and retrieved.                           - The examination was otherwise normal on direct  and retroflexion views. Moderate Sedation:      Moderate (conscious) sedation was personally administered by an       anesthesia professional. The following parameters were monitored: oxygen       saturation, heart rate, blood pressure, respiratory rate, EKG, adequacy       of pulmonary ventilation, and response to care. Recommendation:           - Patient has a contact number available for                            emergencies. The signs and symptoms of potential                            delayed complications were discussed with the                            patient. Return to normal activities tomorrow.                             Written discharge instructions were provided to the                            patient.                           - Resume previous diet.                           - Continue present medications.                           - Repeat colonoscopy date to be determined after                            pending pathology results are reviewed for                            surveillance.                           - Return to GI office (date not yet determined). Procedure Code(s):        --- Professional ---                           [phone], Colonoscopy, flexible; with removal of                            tumor(s), polyp(s), or other lesion(s) by snare                            technique Diagnosis Code(s):        --- Professional ---                           Z12.11, Encounter for screening for malignant  neoplasm of colon                           K63.5, Polyp of colon CPT copyright 2019 [employer]. All rights reserved. The codes documented in this report are preliminary and upon coder review may  be revised to meet current compliance requirements. [name]. [name], MD Norvel Richards, MD 11/07/2020 3:38:43 PM This report has been signed electronically. Number of Addenda: 0

## 2020-11-07 NOTE — Discharge Instructions (Addendum)
Colonoscopy Discharge Instructions  Read the instructions outlined below and refer to this sheet in the next few weeks. These discharge instructions provide you with general information on caring for yourself after you leave the hospital. Your doctor may also give you specific instructions. While your treatment has been planned according to the most current medical practices available, unavoidable complications occasionally occur. If you have any problems or questions after discharge, call Dr. Gala Romney at [phone]. ACTIVITY You may resume your regular activity, but move at a slower pace for the next 24 hours.  Take frequent rest periods for the next 24 hours.  Walking will help get rid of the air and reduce the bloated feeling in your belly (abdomen).  No driving for 24 hours (because of the medicine (anesthesia) used during the test).   Do not sign any important legal documents or operate any machinery for 24 hours (because of the anesthesia used during the test).  NUTRITION Drink plenty of fluids.  You may resume your normal diet as instructed by your doctor.  Begin with a light meal and progress to your normal diet. Heavy or fried foods are harder to digest and may make you feel sick to your stomach (nauseated).  Avoid alcoholic beverages for 24 hours or as instructed.  MEDICATIONS You may resume your normal medications unless your doctor tells you otherwise.  WHAT YOU CAN EXPECT TODAY Some feelings of bloating in the abdomen.  Passage of more gas than usual.  Spotting of blood in your stool or on the toilet paper.  IF YOU HAD POLYPS REMOVED DURING THE COLONOSCOPY: No aspirin products for 7 days or as instructed.  No alcohol for 7 days or as instructed.  Eat a soft diet for the next 24 hours.  FINDING OUT THE RESULTS OF YOUR TEST Not all test results are available during your visit. If your test results are not back during the visit, make an appointment with your caregiver to find out the  results. Do not assume everything is normal if you have not heard from your caregiver or the medical facility. It is important for you to follow up on all of your test results.  SEEK IMMEDIATE MEDICAL ATTENTION IF: You have more than a spotting of blood in your stool.  Your belly is swollen (abdominal distention).  You are nauseated or vomiting.  You have a temperature over 101.  You have abdominal pain or discomfort that is severe or gets worse throughout the day.    1 polyp removed from your colon today.  Further recommendations to follow pending review of pathology report  Patient request, called Louann Sjogren at [phone]   -discussed findings and recommendations  PATIENT INSTRUCTIONS POST-ANESTHESIA  IMMEDIATELY FOLLOWING SURGERY:  Do not drive or operate machinery for the first twenty four hours after surgery.  Do not make any important decisions for twenty four hours after surgery or while taking narcotic pain medications or sedatives.  If you develop intractable nausea and vomiting or a severe headache please notify your doctor immediately.  FOLLOW-UP:  Please make an appointment with your surgeon as instructed. You do not need to follow up with anesthesia unless specifically instructed to do so.  WOUND CARE INSTRUCTIONS (if applicable):  Keep a dry clean dressing on the anesthesia/puncture wound site if there is drainage.  Once the wound has quit draining you may leave it open to air.  Generally you should leave the bandage intact for twenty four hours unless there is drainage.  If the epidural site drains for more than 36-48 hours please call the anesthesia department.  QUESTIONS?:  Please feel free to call your physician or the hospital operator if you have any questions, and they will be happy to assist you.

## 2020-11-11 ENCOUNTER — Encounter: Payer: Self-pay | Admitting: Internal Medicine

## 2020-11-11 LAB — SURGICAL PATHOLOGY

## 2020-11-13 ENCOUNTER — Encounter (HOSPITAL_COMMUNITY): Payer: Self-pay | Admitting: Internal Medicine

## 2022-08-12 ENCOUNTER — Other Ambulatory Visit (HOSPITAL_COMMUNITY)
Admission: RE | Admit: 2022-08-12 | Discharge: 2022-08-12 | Disposition: A | Discharge status: Home / Self Care | Payer: Medicaid Other | Source: Ambulatory Visit | Attending: Internal Medicine | Admitting: Internal Medicine

## 2022-08-12 DIAGNOSIS — I10 Essential (primary) hypertension: Secondary | ICD-10-CM | POA: Insufficient documentation

## 2022-08-12 LAB — CBC WITH DIFFERENTIAL/PLATELET
Abs Immature Granulocytes: 0.01 10*3/uL (ref 0.00–0.07)
Basophils Absolute: 0 10*3/uL (ref 0.0–0.1)
Basophils Relative: 1 %
Eosinophils Absolute: 0.1 10*3/uL (ref 0.0–0.5)
Eosinophils Relative: 4 %
HCT: 42.6 % (ref 39.0–52.0)
Hemoglobin: 14.8 g/dL (ref 13.0–17.0)
Immature Granulocytes: 0 %
Lymphocytes Relative: 36 %
Lymphs Abs: 1.4 10*3/uL (ref 0.7–4.0)
MCH: 31.2 pg (ref 26.0–34.0)
MCHC: 34.7 g/dL (ref 30.0–36.0)
MCV: 89.7 fL (ref 80.0–100.0)
Monocytes Absolute: 0.3 10*3/uL (ref 0.1–1.0)
Monocytes Relative: 8 %
Neutro Abs: 1.9 10*3/uL (ref 1.7–7.7)
Neutrophils Relative %: 51 %
Platelets: 174 10*3/uL (ref 150–400)
RBC: 4.75 MIL/uL (ref 4.22–5.81)
RDW: 12.6 % (ref 11.5–15.5)
WBC: 3.8 10*3/uL — ABNORMAL LOW (ref 4.0–10.5)
nRBC: 0 % (ref 0.0–0.2)

## 2022-08-12 LAB — LIPID PANEL
Cholesterol: 214 mg/dL — ABNORMAL HIGH (ref 0–200)
HDL: 55 mg/dL (ref >40)
LDL Cholesterol: 124 mg/dL — ABNORMAL HIGH (ref 0–99)
Total CHOL/HDL Ratio: 3.9 RATIO
Triglycerides: 175 mg/dL — ABNORMAL HIGH (ref <150)
VLDL: 35 mg/dL (ref 0–40)

## 2022-08-12 LAB — BASIC METABOLIC PANEL
Anion gap: 12 (ref 5–15)
BUN: 16 mg/dL (ref 6–20)
CO2: 24 mmol/L (ref 22–32)
Calcium: 9.2 mg/dL (ref 8.9–10.3)
Chloride: 101 mmol/L (ref 98–111)
Creatinine, Ser: 0.84 mg/dL (ref 0.61–1.24)
GFR, Estimated: >60 mL/min (ref >60)
Glucose, Bld: 109 mg/dL — ABNORMAL HIGH (ref 70–99)
Potassium: 3.4 mmol/L — ABNORMAL LOW (ref 3.5–5.1)
Sodium: 137 mmol/L (ref 135–145)

## 2022-08-12 LAB — HEPATIC FUNCTION PANEL
ALT: 50 U/L — ABNORMAL HIGH (ref 0–44)
AST: 37 U/L (ref 15–41)
Albumin: 4.7 g/dL (ref 3.5–5.0)
Alkaline Phosphatase: 76 U/L (ref 38–126)
Bilirubin, Direct: 0.1 mg/dL (ref 0.0–0.2)
Indirect Bilirubin: 0.5 mg/dL (ref 0.3–0.9)
Total Bilirubin: 0.6 mg/dL (ref 0.3–1.2)
Total Protein: 8.2 g/dL — ABNORMAL HIGH (ref 6.5–8.1)

## 2022-08-12 LAB — TSH: TSH: 3.791 u[IU]/mL (ref 0.350–4.500)

## 2022-08-12 LAB — PSA: Prostatic Specific Antigen: 0.56 ng/mL (ref 0.00–4.00)

## 2022-08-12 LAB — T4, FREE: Free T4: 0.76 ng/dL (ref 0.61–1.12)

## 2023-03-18 ENCOUNTER — Emergency Department (HOSPITAL_COMMUNITY)
Admission: EM | Admit: 2023-03-18 | Discharge: 2023-03-18 | Disposition: A | Discharge status: Home / Self Care | Payer: Medicaid Other | Attending: Emergency Medicine | Admitting: Emergency Medicine

## 2023-03-18 ENCOUNTER — Emergency Department (HOSPITAL_COMMUNITY): Payer: Medicaid Other

## 2023-03-18 ENCOUNTER — Other Ambulatory Visit: Payer: Self-pay

## 2023-03-18 DIAGNOSIS — Z87891 Personal history of nicotine dependence: Secondary | ICD-10-CM | POA: Insufficient documentation

## 2023-03-18 DIAGNOSIS — M25561 Pain in right knee: Secondary | ICD-10-CM | POA: Insufficient documentation

## 2023-03-18 DIAGNOSIS — Z79899 Other long term (current) drug therapy: Secondary | ICD-10-CM | POA: Insufficient documentation

## 2023-03-18 DIAGNOSIS — I10 Essential (primary) hypertension: Secondary | ICD-10-CM | POA: Insufficient documentation

## 2023-03-18 MED ORDER — KETOROLAC TROMETHAMINE 15 MG/ML IJ SOLN
15.0000 mg | Freq: Once | INTRAMUSCULAR | Status: AC
  Administered 2023-03-18: 15 mg via INTRAMUSCULAR
  Filled 2023-03-18: qty 1

## 2023-03-18 MED ORDER — IBUPROFEN 600 MG PO TABS: 600.0000 mg | ORAL_TABLET | Freq: Four times a day (QID) | ORAL | 0 refills | Status: AC | PRN

## 2023-03-18 NOTE — ED Provider Notes (Signed)
[place] EMERGENCY DEPARTMENT AT [facility] Provider Note  CSN: [id number] Arrival date & time: 03/18/23 0701  Chief Complaint(s) Knee Pain (Right knee)  HPI Chad Robinson is a 52 y.o. male without significant past medical history presenting to the emergency department with right knee pain.  He reports a prior history of right knee pain.  Reports that it worsened again when he was getting out of his truck and landed putting some weight on his right leg.  Has been ambulatory.  Not taking anything for his symptoms at home.  Put some towels on it which did not really help.  Reports he saw an orthopedic surgeon previously who told him that he had arthritis.  No fevers or chills.   Past Medical History Past Medical History:  Diagnosis Date   Hypertension    Patient Active Problem List   Diagnosis Date Noted   Preventative health care 09/09/2020   Home Medication(s) Prior to Admission medications   Medication Sig Start Date End Date Taking? Authorizing Provider  ibuprofen (ADVIL) 600 MG tablet Take 1 tablet (600 mg total) by mouth every 6 (six) hours as needed. 03/18/23  Yes Lonell Grandchild, MD  amLODipine (NORVASC) 10 MG tablet Take 1 tablet by mouth daily. 09/05/20   [provider]  CLENPIQ 10-3.5-12 MG-GM -GM/160ML SOLN Take 320 mLs by mouth as directed. 09/09/20   [provider]  lisinopril-hydrochlorothiazide (ZESTORETIC) 20-25 MG tablet Take 1 tablet by mouth daily. 09/03/20   [provider]                                                                                                                                    Past Surgical History Past Surgical History:  Procedure Laterality Date   COLONOSCOPY WITH PROPOFOL N/A 11/07/2020   Procedure: COLONOSCOPY WITH PROPOFOL;  Surgeon: Corbin Ade, MD;  Location: [facility] ENDO SUITE;  Service: Endoscopy;  Laterality: N/A;  10:30am   NO PAST SURGERIES     POLYPECTOMY  11/07/2020   Procedure:  POLYPECTOMY;  Surgeon: Corbin Ade, MD;  Location: [facility] ENDO SUITE;  Service: Endoscopy;;   Family History Family History  Problem Relation Age of Onset   Colon cancer Neg Hx     Social History Social History   Tobacco Use   Smoking status: Former    Types: Cigarettes   Smokeless tobacco: Never  Vaping Use   Vaping status: Never Used  Substance Use Topics   Alcohol use: Yes    Alcohol/week: 3.0 - 4.0 standard drinks of alcohol    Types: 3 - 4 Cans of beer per week    Comment: daily   Drug use: Never   Allergies Fish allergy  Review of Systems Review of Systems  All other systems reviewed and are negative.   Physical Exam Vital Signs  I have reviewed the triage vital signs BP 109/79   Pulse 73  Temp 98.1 F (36.7 C) (Oral)   Resp 16   Ht 5\' 11"  (1.803 m)   Wt 30.7 kg   SpO2 96%   BMI 9.45 kg/m  Physical Exam Vitals and nursing note reviewed.  Constitutional:      General: He is not in acute distress.    Appearance: Normal appearance.  HENT:     Head: Normocephalic and atraumatic.     Mouth/Throat:     Mouth: Mucous membranes are moist.  Eyes:     Conjunctiva/sclera: Conjunctivae normal.  Cardiovascular:     Rate and Rhythm: Normal rate.  Pulmonary:     Effort: Pulmonary effort is normal. No respiratory distress.  Abdominal:     General: Abdomen is flat.  Musculoskeletal:     Comments: Slightly painful range of motion of the right knee without effusion.  No tenderness  Skin:    General: Skin is warm and dry.     Capillary Refill: Capillary refill takes less than 2 seconds.  Neurological:     General: No focal deficit present.     Mental Status: He is alert. Mental status is at baseline.  Psychiatric:        Mood and Affect: Mood normal.        Behavior: Behavior normal.     ED Results and Treatments Labs (all labs ordered are listed, but only abnormal results are displayed) Labs Reviewed - No data to display                                                                                                                         Radiology DG Knee Complete 4 Views Right  Result Date: 03/18/2023 CLINICAL DATA:  knee pain EXAM: RIGHT KNEE - COMPLETE 4+ VIEW COMPARISON:  Right knee radiographs dated July 13, 2017. FINDINGS: No acute osseous abnormality. Tricompartmental osteophytosis and joint space narrowing, most pronounced in the medial and patellofemoral compartments. These findings have progressed since the prior exam. Soft tissues are unremarkable. IMPRESSION: 1. No acute osseous abnormality. 2. 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 Electronically Signed   By: Hart Robinsons M.D.   On: 03/18/2023 08:57    Pertinent labs & imaging results that were available during my care of the patient were reviewed by me and considered in my medical decision making (see MDM for details).  Medications Ordered in ED Medications  ketorolac (TORADOL) 15 MG/ML injection 15 mg (has no administration in time range)  Procedures Procedures  (including critical care time)  Medical Decision Making / ED Course   MDM:  52 year old male presenting to the emergency department knee pain.  X-ray shows progression of osteoarthritis.  Suspect symptoms are related to underlying arthritis.  Less likely other acute issues such as ligamentous injury or meniscus tear but also possible so recommended follow-up with orthopedic surgery.  No fracture on x-ray and doubt occult fracture as patient has been ambulatory.  No swelling to suggest crystal arthropathy or septic joint.  Very low concern for dislocation. Will discharge patient to home. All questions answered. Patient comfortable with plan of discharge. Return precautions discussed with patient and specified on the after visit summary.       Imaging  Studies ordered: I ordered imaging studies including XR knee On my interpretation imaging demonstrates osteoarthritis  I independently visualized and interpreted imaging. I agree with the radiologist interpretation   Medicines ordered and prescription drug management: Meds ordered this encounter  Medications   ketorolac (TORADOL) 15 MG/ML injection 15 mg   ibuprofen (ADVIL) 600 MG tablet    Sig: Take 1 tablet (600 mg total) by mouth every 6 (six) hours as needed.    Dispense:  30 tablet    Refill:  0    -I have reviewed the patients home medicines and have made adjustments as needed   Reevaluation: After the interventions noted above, I reevaluated the patient and found that their symptoms have improved  Co morbidities that complicate the patient evaluation  Past Medical History:  Diagnosis Date   Hypertension       Dispostion: Disposition decision including need for hospitalization was considered, and patient discharged from emergency department.    Final Clinical Impression(s) / ED Diagnoses Final diagnoses:  Acute pain of right knee     This chart was dictated using voice recognition software.  Despite best efforts to proofread,  errors can occur which can change the documentation meaning.    Lonell Grandchild, MD 03/18/23 [phone]

## 2023-03-18 NOTE — Discharge Instructions (Signed)
We evaluated you for your knee pain.  Your x-ray did not show any broken bones.  Your symptoms are most likely due to arthritis.  It is possible you have a sprained ligament or meniscus so I would recommend following up with an orthopedic doctor.  You can follow-up with Dr. Aundria Rud.  Please take Tylenol and Motrin for your symptoms at home.  You can take 1000 mg of Tylenol every 6 hours and 600 mg of ibuprofen every 6 hours as needed for your symptoms.  You can take these medicines together as needed, either at the same time, or alternating every 3 hours.

## 2023-03-18 NOTE — ED Triage Notes (Signed)
Pt complains of right knee pain that started last week. Pt denies injury.

## 2023-08-31 ENCOUNTER — Other Ambulatory Visit (HOSPITAL_COMMUNITY)
Admission: RE | Admit: 2023-08-31 | Discharge: 2023-08-31 | Disposition: A | Discharge status: Home / Self Care | Source: Ambulatory Visit | Attending: Internal Medicine | Admitting: Internal Medicine

## 2023-08-31 DIAGNOSIS — R7303 Prediabetes: Secondary | ICD-10-CM | POA: Diagnosis present

## 2023-08-31 LAB — BASIC METABOLIC PANEL WITH GFR
Anion gap: 7 (ref 5–15)
BUN: 14 mg/dL (ref 6–20)
CO2: 28 mmol/L (ref 22–32)
Calcium: 9.3 mg/dL (ref 8.9–10.3)
Chloride: 102 mmol/L (ref 98–111)
Creatinine, Ser: 0.83 mg/dL (ref 0.61–1.24)
GFR, Estimated: >60 mL/min (ref >60)
Glucose, Bld: 102 mg/dL — ABNORMAL HIGH (ref 70–99)
Potassium: 3.3 mmol/L — ABNORMAL LOW (ref 3.5–5.1)
Sodium: 137 mmol/L (ref 135–145)

## 2023-08-31 LAB — CBC WITH DIFFERENTIAL/PLATELET
Abs Immature Granulocytes: 0.01 10*3/uL (ref 0.00–0.07)
Basophils Absolute: 0 10*3/uL (ref 0.0–0.1)
Basophils Relative: 1 %
Eosinophils Absolute: 0.1 10*3/uL (ref 0.0–0.5)
Eosinophils Relative: 2 %
HCT: 41.4 % (ref 39.0–52.0)
Hemoglobin: 14.2 g/dL (ref 13.0–17.0)
Immature Granulocytes: 0 %
Lymphocytes Relative: 38 %
Lymphs Abs: 1.6 10*3/uL (ref 0.7–4.0)
MCH: 31.1 pg (ref 26.0–34.0)
MCHC: 34.3 g/dL (ref 30.0–36.0)
MCV: 90.6 fL (ref 80.0–100.0)
Monocytes Absolute: 0.4 10*3/uL (ref 0.1–1.0)
Monocytes Relative: 9 %
Neutro Abs: 2.1 10*3/uL (ref 1.7–7.7)
Neutrophils Relative %: 50 %
Platelets: 187 10*3/uL (ref 150–400)
RBC: 4.57 MIL/uL (ref 4.22–5.81)
RDW: 12.9 % (ref 11.5–15.5)
WBC: 4.2 10*3/uL (ref 4.0–10.5)
nRBC: 0 % (ref 0.0–0.2)

## 2023-08-31 LAB — LIPID PANEL
Cholesterol: 215 mg/dL — ABNORMAL HIGH (ref 0–200)
HDL: 50 mg/dL (ref >40)
LDL Cholesterol: 145 mg/dL — ABNORMAL HIGH (ref 0–99)
Total CHOL/HDL Ratio: 4.3 ratio
Triglycerides: 99 mg/dL (ref <150)
VLDL: 20 mg/dL (ref 0–40)

## 2023-08-31 LAB — HEPATIC FUNCTION PANEL
ALT: 39 U/L (ref 0–44)
AST: 31 U/L (ref 15–41)
Albumin: 4.4 g/dL (ref 3.5–5.0)
Alkaline Phosphatase: 84 U/L (ref 38–126)
Bilirubin, Direct: 0.1 mg/dL (ref 0.0–0.2)
Indirect Bilirubin: 0.6 mg/dL (ref 0.3–0.9)
Total Bilirubin: 0.7 mg/dL (ref 0.0–1.2)
Total Protein: 7.8 g/dL (ref 6.5–8.1)

## 2023-08-31 LAB — HEMOGLOBIN A1C
Hgb A1c MFr Bld: 5.8 % — ABNORMAL HIGH (ref 4.8–5.6)
Mean Plasma Glucose: 119.76 mg/dL

## 2023-09-01 LAB — MICROALBUMIN / CREATININE URINE RATIO
Creatinine, Urine: 86.9 mg/dL
Microalb Creat Ratio: 78 mg/g{creat} — ABNORMAL HIGH (ref 0–29)
Microalb, Ur: 67.7 ug/mL — ABNORMAL HIGH

## 2023-12-06 ENCOUNTER — Other Ambulatory Visit (HOSPITAL_COMMUNITY): Payer: Self-pay | Admitting: Gerontology

## 2023-12-06 ENCOUNTER — Ambulatory Visit (HOSPITAL_COMMUNITY)
Admission: RE | Admit: 2023-12-06 | Discharge: 2023-12-06 | Disposition: A | Discharge status: Home / Self Care | Source: Ambulatory Visit | Attending: Gerontology | Admitting: Gerontology

## 2023-12-06 DIAGNOSIS — M25562 Pain in left knee: Secondary | ICD-10-CM

## 2024-06-04 ENCOUNTER — Other Ambulatory Visit (HOSPITAL_COMMUNITY)
Admission: RE | Admit: 2024-06-04 | Discharge: 2024-06-04 | Disposition: A | Discharge status: Home / Self Care | Source: Ambulatory Visit | Attending: Internal Medicine | Admitting: Internal Medicine

## 2024-06-04 DIAGNOSIS — I10 Essential (primary) hypertension: Secondary | ICD-10-CM | POA: Insufficient documentation

## 2024-06-04 LAB — BASIC METABOLIC PANEL WITH GFR
Anion gap: 12 (ref 5–15)
BUN: 10 mg/dL (ref 6–20)
CO2: 28 mmol/L (ref 22–32)
Calcium: 9.3 mg/dL (ref 8.9–10.3)
Chloride: 102 mmol/L (ref 98–111)
Creatinine, Ser: 0.85 mg/dL (ref 0.61–1.24)
GFR, Estimated: >60 mL/min
Glucose, Bld: 106 mg/dL — ABNORMAL HIGH (ref 70–99)
Potassium: 3.7 mmol/L (ref 3.5–5.1)
Sodium: 142 mmol/L (ref 135–145)
